# Patient Record
Sex: Male | Born: 1960 | Race: Black or African American | Hispanic: No | Marital: Married | Smoking: Never smoker
Health system: Southern US, Community
[De-identification: ages and names within clinical notes are randomized; demographics above are authoritative.]

## PROBLEM LIST (undated history)

## (undated) DIAGNOSIS — D72819 Decreased white blood cell count, unspecified: Secondary | ICD-10-CM

## (undated) DIAGNOSIS — I10 Essential (primary) hypertension: Secondary | ICD-10-CM

## (undated) HISTORY — DX: Decreased white blood cell count, unspecified: D72.819

## (undated) HISTORY — DX: Essential (primary) hypertension: I10

---

## 1999-09-10 ENCOUNTER — Encounter: Payer: Self-pay | Admitting: *Deleted

## 1999-09-10 ENCOUNTER — Ambulatory Visit (HOSPITAL_COMMUNITY): Admission: RE | Admit: 1999-09-10 | Discharge: 1999-09-10 | Payer: Self-pay | Admitting: *Deleted

## 1999-10-15 ENCOUNTER — Ambulatory Visit (HOSPITAL_COMMUNITY): Admission: RE | Admit: 1999-10-15 | Discharge: 1999-10-15 | Payer: Self-pay | Admitting: Endocrinology

## 2005-11-28 HISTORY — PX: VASECTOMY: SHX75

## 2006-03-06 ENCOUNTER — Ambulatory Visit (HOSPITAL_BASED_OUTPATIENT_CLINIC_OR_DEPARTMENT_OTHER): Admission: RE | Admit: 2006-03-06 | Discharge: 2006-03-06 | Payer: Self-pay | Admitting: Urology

## 2006-03-06 ENCOUNTER — Encounter (INDEPENDENT_AMBULATORY_CARE_PROVIDER_SITE_OTHER): Payer: Self-pay | Admitting: *Deleted

## 2008-05-06 ENCOUNTER — Encounter: Admission: RE | Admit: 2008-05-06 | Discharge: 2008-05-06 | Payer: Self-pay | Admitting: Family Medicine

## 2008-11-28 HISTORY — PX: OTHER SURGICAL HISTORY: SHX169

## 2008-11-28 HISTORY — PX: APPENDECTOMY: SHX54

## 2009-03-31 ENCOUNTER — Emergency Department (HOSPITAL_COMMUNITY): Admission: EM | Admit: 2009-03-31 | Discharge: 2009-03-31 | Payer: Self-pay | Admitting: Family Medicine

## 2009-03-31 ENCOUNTER — Observation Stay (HOSPITAL_COMMUNITY): Admission: EM | Admit: 2009-03-31 | Discharge: 2009-04-01 | Payer: Self-pay | Admitting: Emergency Medicine

## 2009-03-31 ENCOUNTER — Encounter (INDEPENDENT_AMBULATORY_CARE_PROVIDER_SITE_OTHER): Payer: Self-pay | Admitting: General Surgery

## 2010-11-12 ENCOUNTER — Ambulatory Visit: Payer: Self-pay | Admitting: Oncology

## 2011-01-14 ENCOUNTER — Other Ambulatory Visit: Payer: Self-pay | Admitting: Oncology

## 2011-01-14 ENCOUNTER — Encounter (HOSPITAL_BASED_OUTPATIENT_CLINIC_OR_DEPARTMENT_OTHER): Payer: BC Managed Care – PPO | Admitting: Oncology

## 2011-01-14 ENCOUNTER — Encounter: Payer: Self-pay | Admitting: Oncology

## 2011-01-14 DIAGNOSIS — D72819 Decreased white blood cell count, unspecified: Secondary | ICD-10-CM

## 2011-01-14 LAB — MORPHOLOGY: RBC Comments: NORMAL

## 2011-01-14 LAB — CBC WITH DIFFERENTIAL/PLATELET
BASO%: 0.6 % (ref 0.0–2.0)
Basophils Absolute: 0 10*3/uL (ref 0.0–0.1)
EOS%: 2 % (ref 0.0–7.0)
Eosinophils Absolute: 0.1 10*3/uL (ref 0.0–0.5)
HCT: 37.8 % — ABNORMAL LOW (ref 38.4–49.9)
HGB: 13.1 g/dL (ref 13.0–17.1)
LYMPH%: 28.9 % (ref 14.0–49.0)
MCH: 30.9 pg (ref 27.2–33.4)
MCHC: 34.7 g/dL (ref 32.0–36.0)
MCV: 88.9 fL (ref 79.3–98.0)
MONO#: 0.5 10*3/uL (ref 0.1–0.9)
MONO%: 14.9 % — ABNORMAL HIGH (ref 0.0–14.0)
NEUT#: 1.9 10*3/uL (ref 1.5–6.5)
NEUT%: 53.6 % (ref 39.0–75.0)
Platelets: 185 10*3/uL (ref 140–400)
RBC: 4.25 10*6/uL (ref 4.20–5.82)
RDW: 13.2 % (ref 11.0–14.6)
WBC: 3.6 10*3/uL — ABNORMAL LOW (ref 4.0–10.3)
lymph#: 1 10*3/uL (ref 0.9–3.3)

## 2011-01-14 LAB — COMPREHENSIVE METABOLIC PANEL
AST: 31 U/L (ref 0–37)
Alkaline Phosphatase: 47 U/L (ref 39–117)
BUN: 12 mg/dL (ref 6–23)
Creatinine, Ser: 1.2 mg/dL (ref 0.40–1.50)
Total Bilirubin: 0.4 mg/dL (ref 0.3–1.2)

## 2011-01-14 LAB — CHCC SMEAR

## 2011-01-17 LAB — ANA: Anti Nuclear Antibody(ANA): NEGATIVE

## 2011-03-08 LAB — URINALYSIS, ROUTINE W REFLEX MICROSCOPIC
Bilirubin Urine: NEGATIVE
Glucose, UA: NEGATIVE mg/dL
Hgb urine dipstick: NEGATIVE
Ketones, ur: NEGATIVE mg/dL
Leukocytes, UA: NEGATIVE
Nitrite: NEGATIVE
Protein, ur: 30 mg/dL — AB
Specific Gravity, Urine: 1.027 (ref 1.005–1.030)
Urobilinogen, UA: 1 mg/dL (ref 0.0–1.0)
pH: 7 (ref 5.0–8.0)

## 2011-03-08 LAB — COMPREHENSIVE METABOLIC PANEL
ALT: 31 U/L (ref 0–53)
AST: 44 U/L — ABNORMAL HIGH (ref 0–37)
Albumin: 3.8 g/dL (ref 3.5–5.2)
Alkaline Phosphatase: 52 U/L (ref 39–117)
BUN: 9 mg/dL (ref 6–23)
CO2: 29 mEq/L (ref 19–32)
Calcium: 9.1 mg/dL (ref 8.4–10.5)
Chloride: 99 mEq/L (ref 96–112)
Creatinine, Ser: 1.19 mg/dL (ref 0.4–1.5)
GFR calc Af Amer: >60 mL/min (ref >60)
GFR calc non Af Amer: >60 mL/min (ref >60)
Glucose, Bld: 117 mg/dL — ABNORMAL HIGH (ref 70–99)
Potassium: 4.8 mEq/L (ref 3.5–5.1)
Sodium: 136 mEq/L (ref 135–145)
Total Bilirubin: 1.4 mg/dL — ABNORMAL HIGH (ref 0.3–1.2)
Total Protein: 7.4 g/dL (ref 6.0–8.3)

## 2011-03-08 LAB — DIFFERENTIAL
Basophils Absolute: 0 10*3/uL (ref 0.0–0.1)
Basophils Relative: 0 % (ref 0–1)
Eosinophils Absolute: 0 10*3/uL (ref 0.0–0.7)
Eosinophils Relative: 0 % (ref 0–5)
Lymphocytes Relative: 11 % — ABNORMAL LOW (ref 12–46)
Lymphs Abs: 0.8 10*3/uL (ref 0.7–4.0)
Monocytes Absolute: 0.7 10*3/uL (ref 0.1–1.0)
Monocytes Relative: 10 % (ref 3–12)
Neutro Abs: 5.9 10*3/uL (ref 1.7–7.7)
Neutrophils Relative %: 79 % — ABNORMAL HIGH (ref 43–77)

## 2011-03-08 LAB — CBC
HCT: 40.7 % (ref 39.0–52.0)
Hemoglobin: 14.3 g/dL (ref 13.0–17.0)
MCHC: 35 g/dL (ref 30.0–36.0)
MCV: 89.6 fL (ref 78.0–100.0)
Platelets: 167 10*3/uL (ref 150–400)
RBC: 4.54 MIL/uL (ref 4.22–5.81)
RDW: 14.1 % (ref 11.5–15.5)
WBC: 7.5 10*3/uL (ref 4.0–10.5)

## 2011-03-08 LAB — LIPASE, BLOOD: Lipase: 18 U/L (ref 11–59)

## 2011-03-08 LAB — URINE MICROSCOPIC-ADD ON

## 2011-04-12 NOTE — H&P (Signed)
NAME:  Jay Arnold, Jay Arnold NO.:  [id number]   MEDICAL RECORD NO.:  [id number]          PATIENT TYPE:  INP   LOCATION:  5120                         FACILITY:  [facility]   PHYSICIAN:  Sandria Bales. Ezzard Standing, M.D.  DATE OF BIRTH:  1961-06-27   DATE OF ADMISSION:  03/31/2009  DATE OF DISCHARGE:                              HISTORY & PHYSICAL   Date of Admission - May 2010   HISTORY OF ILLNESS:  This is a 50 year old otherwise healthy black male.  He is the patient of Dr. Joselyn Arrow, started having abdominal pain on  Sunday the May 2.  This pain got worse and localized to the right lower  quadrant.  He came to the [facility] Emergency Room today, and underwent  an evaluation.   Apparently, he gets claustrophobic going to the CAT scan, so he was  gavin Ativan, to sedate him throught the CAT scan.  His CAT scan showed  evidence of acute appendicitis.   The patient himself denies any history of peptic ulcer disease, liver  disease, gallbladder disease, pancreas disease, or colon disease.  He  had no prior abdominal surgery.  Actually, he had a normal white blood  count.   PAST MEDICAL HISTORY:  He has no allergies.   He is on no medications.   REVIEW OF SYSTEMS:  NEUROLOGIC:  He has had no history of seizure or  loss of conscious.  PULMONARY:  Does not smoke cigarettes.  Denies pneumonia or  tuberculosis.  CARDIAC:  He has no history of heart disease, chest pain, or  hypertension.  GASTROINTESTINAL:  See history of present illness.  UROLOGIC:  No kidney stones or kidney infections.  MUSCULOSKELETAL:  No back or joint problems.   He is married.  His wife's name is Elnita Maxwell, her phone number is [phone].  She was not there, but I talked to her on the phone.   He works as Engineer, building services for [employer].   PHYSICAL EXAMINATION:  VITAL SIGNS:  His temperature is 98.1, blood  pressure 122/74, pulse is 79, respirations 16, sats were 95%.  GENERAL:  He is a well-nourished,  pleasant black male, alert and  cooperative on physical exam.  HEENT:  Unremarkable.  NECK:  Supple without mass, without thyromegaly.  LUNGS:  Clear to auscultation with symmetric breath sounds.  HEART:  Regular rate and rhythm without murmur or rub.  ABDOMEN:  He is a fully stocky guy, but he has got tenderness in the  right lower quadrant with some mild guarding, mild rebound, but he  actually has active bowel sounds.  I see no abdominal scars.  He has no  abdominal hernia.  EXTREMITIES:  He has good strength in all 4 extremities.  NEUROLOGIC:  Grossly intact.   LABORATORY DATA:  Lipase of 18.  He has a white blood count of 7400, but  that is not typed up in the sheet that I have from the ER.  I reviewed his CT scan with Paulina Fusi which was consistent with acute  appendicitis where his pain is located.   IMPRESSION:  Acute appendicitis.  I discussed with the patient and his  wife, indications, potential complications of appendectomy, potential  complications include bleeding, infection, which I think he already has.  I will try this laparoscopically, but there is always chance of open  surgery and there is chance that there could be something else causing  his pain rather appendicitis.  He otherwise though seemed to be a  healthy male who I think is a good candidate for surgery and proceeding  with surgery at this time.      Sandria Bales. Ezzard Standing, M.D.  Electronically Signed     DHN/MEDQ  D:  03/31/2009  T:  04/01/2009  Job:  [id number]   cc:   Lavonda Jumbo, M.D.

## 2011-04-12 NOTE — Op Note (Signed)
NAME:  Jay Arnold, Jay Arnold NO.:  [id number]   MEDICAL RECORD NO.:  [id number]          PATIENT TYPE:  INP   LOCATION:  5120                         FACILITY:  [facility]   PHYSICIAN:  Sandria Bales. Ezzard Standing, M.D.  DATE OF BIRTH:  1961/04/09   DATE OF PROCEDURE:  03/31/2009  DATE OF DISCHARGE:  03/31/2009                               OPERATIVE REPORT   Date of Surgery - May 2010   PREOPERATIVE DIAGNOSIS:  Appendicitis.   POSTOPERATIVE DIAGNOSIS:  Appendicitis.   PROCEDURE:  Laparoscopic appendectomy.   SURGEON:  Sandria Bales. Ezzard Standing, MD   FIRST ASSISTANT:  None.   ANESTHESIA:  General endotracheal.   ESTIMATED BLOOD LOSS:  Minimal.   INDICATIONS FOR THE PROCEDURE:  Ms. Allinson is a 50 year old black male  who presented with a 2-day history of abdominal pain.  She is a patient  of Dr. Joselyn Arrow, who came to the [facility] Emergency Room.  The CT  scan was consistent with appendicitis.  I discussed with the patient  about proceeding with appendectomy.  I discussed the indications and  potential complications.   The potential complications of appendectomy include, but are not limited  to, bleeding, infection (which I think he already has), open surgery,  and the need for bowel resection or other diagnosis causing this pain.   OPERATIVE NOTE:  The patient was placed in a supine position with left arm tucked, right  arm out.  Foley catheter placed.  Abdomen was shaved, prepped, with  Betadine solution and sterilely draped, and given a general endotracheal  anesthetic supervised by Dr. Laverle Hobby.  A time-out was held  identifying the patient and the procedure.   The abdomen was accessed with a Hasson trocar through an infraumbilical  incision.  The Hasson trocar was secured with a 0 Vicryl suture.  A 5-mm  trocar was placed in the right upper quadrant and 11 mm in left lower  quadrant.  Abdominal exploration carried out.  The right and left lobes  of liver were  unremarkable.  Stomach unremarkable.  The bowel I could  see was unremarkable.  In the right lower quadrant was an inflammatory  mass, which was consistent with appendicitis and actually the  antimesenteric fold of the terminal ileum was wrapped over the appendix.  I had to peel that off and the appendix was sort of curled underneath  the cecum which I had to mobilize the cecum up and medially to get to  the base.   He had gangrenous changes of the distal end of the appendix with some  surrounding purulence.  I took down the mesentery of the appendix with a  harmonic scalpel.  I got to the base of appendix.  I used a vascular  load of the [employer] GIA 45-mm stapler across the base of the  appendix and divided this.   The appendix was then placed EndoCatch bag and delivered through the  umbilicus.  The abdomen was irrigated with saline.   I then reinspected the staple line and cecum which all looked okay.   I removed all the trocars  under direct visualization.  There was no  bleeding at trocar site.  I closed the umbilical port with a 0 Vicryl  suture.  The skin at each site was closed the 5-0 Vicryl suture, painted  the wounds with tincture of benzoin and Steri-Strips.   The patient tolerated the procedure well and was transported to the  recovery room in good condition.  Sponge and needle counts were correct  at the end of the case.      Sandria Bales. Ezzard Standing, M.D.  Electronically Signed     DHN/MEDQ  D:  03/31/2009  T:  04/01/2009  Job:  [id number]   cc:   Lavonda Jumbo, M.D.

## 2011-04-15 NOTE — Op Note (Signed)
NAME:  Jay Arnold, RUPPERT NO.:  [id number]   MEDICAL RECORD NO.:  [id number]          PATIENT TYPE:  AMB   LOCATION:  [facility]                         FACILITY:  [facility]   PHYSICIAN:  Lindaann Slough, M.D.  DATE OF BIRTH:  1960/12/22   DATE OF PROCEDURE:  03/06/2006  DATE OF DISCHARGE:                                 OPERATIVE REPORT   PREOPERATIVE DIAGNOSIS:  Phimosis and elective sterilization procedure   POSTOPERATIVE DIAGNOSIS: Same   PROCEDURE: circumcision, bilateral vasectomy.   SURGEON:  Dr. Brunilda Payor.   ANESTHESIA:  General.   INDICATIONS:  The patient is a 50 years old male who had been complaining of  difficulty retracting his foreskin.  He has had multiple breakdowns of the  foreskin.  He also would like to have a vasectomy.  Both procedures have  been discussed in detail with the patient and the risks include but are not  limited to hemorrhage, infection and  specifically regarding the vasectomy,  early or late recanalization.  He understands and would like to proceed.   Under general anesthesia, the patient was prepped and draped and placed in  the supine position.  Two circumferential incisions were made on the  foreskin and the foreskin between those two incisions was excised.  Hemostasis was secured with electrocautery.  Then skin approximation was  done with #4-0 chromic.  A penile block was done with 0.5% Marcaine.   Then a 0.5 cm incision was made on the left scrotum.  The incision was  carried down to the subcutaneous tissues.  The vas was secured with a vas  clamp.  It was then separated from the vas sheath.  A segment of the vas was  excised.  Each end of the vas was then fulgurated and doubly ligated with #0  Vicryl.  The distal end of the vas was then covered with the vas sheath.  Then the incision was closed with #3-0 Vicryl.  The same procedure was then  done on the right side.   The patient tolerated the procedure well and left the OR in  satisfactory  condition to post anesthesia care unit.      Lindaann Slough, M.D.  Electronically Signed     MN/MEDQ  D:  03/06/2006  T:  03/07/2006  Job:  [id number]

## 2011-06-15 ENCOUNTER — Encounter (HOSPITAL_BASED_OUTPATIENT_CLINIC_OR_DEPARTMENT_OTHER): Payer: BC Managed Care – PPO | Admitting: Oncology

## 2011-06-15 ENCOUNTER — Other Ambulatory Visit: Payer: Self-pay | Admitting: Oncology

## 2011-06-15 DIAGNOSIS — D72819 Decreased white blood cell count, unspecified: Secondary | ICD-10-CM

## 2011-06-15 LAB — CBC WITH DIFFERENTIAL/PLATELET
BASO%: 0.6 % (ref 0.0–2.0)
MCHC: 34.3 g/dL (ref 32.0–36.0)
MONO#: 0.5 10*3/uL (ref 0.1–0.9)
RBC: 4.27 10*6/uL (ref 4.20–5.82)
RDW: 14.3 % (ref 11.0–14.6)
WBC: 3.5 10*3/uL — ABNORMAL LOW (ref 4.0–10.3)
lymph#: 1.2 10*3/uL (ref 0.9–3.3)

## 2011-11-04 ENCOUNTER — Encounter: Payer: Self-pay | Admitting: *Deleted

## 2011-11-07 ENCOUNTER — Encounter: Payer: Self-pay | Admitting: Oncology

## 2011-11-07 DIAGNOSIS — D72819 Decreased white blood cell count, unspecified: Secondary | ICD-10-CM | POA: Insufficient documentation

## 2011-11-14 ENCOUNTER — Ambulatory Visit (HOSPITAL_BASED_OUTPATIENT_CLINIC_OR_DEPARTMENT_OTHER): Payer: BC Managed Care – PPO | Admitting: Oncology

## 2011-11-14 ENCOUNTER — Other Ambulatory Visit: Payer: Self-pay | Admitting: Oncology

## 2011-11-14 ENCOUNTER — Encounter: Payer: Self-pay | Admitting: Gastroenterology

## 2011-11-14 ENCOUNTER — Other Ambulatory Visit (HOSPITAL_BASED_OUTPATIENT_CLINIC_OR_DEPARTMENT_OTHER): Payer: BC Managed Care – PPO | Admitting: Lab

## 2011-11-14 VITALS — BP 152/93 | HR 68 | Temp 97.9°F | Ht 70.0 in | Wt 200.5 lb

## 2011-11-14 DIAGNOSIS — K921 Melena: Secondary | ICD-10-CM

## 2011-11-14 DIAGNOSIS — Z800 Family history of malignant neoplasm of digestive organs: Secondary | ICD-10-CM

## 2011-11-14 DIAGNOSIS — Z23 Encounter for immunization: Secondary | ICD-10-CM

## 2011-11-14 DIAGNOSIS — D72819 Decreased white blood cell count, unspecified: Secondary | ICD-10-CM

## 2011-11-14 LAB — COMPREHENSIVE METABOLIC PANEL WITH GFR
ALT: 27 U/L (ref 0–53)
AST: 27 U/L (ref 0–37)
Albumin: 4 g/dL (ref 3.5–5.2)
Alkaline Phosphatase: 46 U/L (ref 39–117)
BUN: 16 mg/dL (ref 6–23)
CO2: 29 meq/L (ref 19–32)
Calcium: 8.7 mg/dL (ref 8.4–10.5)
Chloride: 103 meq/L (ref 96–112)
Creatinine, Ser: 1.06 mg/dL (ref 0.50–1.35)
Glucose, Bld: 119 mg/dL — ABNORMAL HIGH (ref 70–99)
Potassium: 3.9 meq/L (ref 3.5–5.3)
Sodium: 140 meq/L (ref 135–145)
Total Bilirubin: 0.6 mg/dL (ref 0.3–1.2)
Total Protein: 6.8 g/dL (ref 6.0–8.3)

## 2011-11-14 LAB — CBC WITH DIFFERENTIAL/PLATELET
BASO%: 0.5 % (ref 0.0–2.0)
Basophils Absolute: 0 10*3/uL (ref 0.0–0.1)
HCT: 38.6 % (ref 38.4–49.9)
HGB: 13.3 g/dL (ref 13.0–17.1)
MONO#: 0.4 10*3/uL (ref 0.1–0.9)
NEUT%: 57 % (ref 39.0–75.0)
WBC: 3.8 10*3/uL — ABNORMAL LOW (ref 4.0–10.3)
lymph#: 1.1 10*3/uL (ref 0.9–3.3)

## 2011-11-14 MED ORDER — INFLUENZA VIRUS VACC SPLIT PF IM SUSP
0.5000 mL | INTRAMUSCULAR | Status: DC
  Filled 2011-11-14: qty 0.5

## 2011-11-14 MED ORDER — INFLUENZA VIRUS VACC SPLIT PF IM SUSP
0.5000 mL | Freq: Once | INTRAMUSCULAR | Status: AC
  Administered 2011-11-14: 0.5 mL via INTRAMUSCULAR
  Filled 2011-11-14: qty 0.5

## 2011-11-14 NOTE — Progress Notes (Signed)
[facility] OFFICE PROGRESS NOTE  Cc:  Cain Saupe, M.D.  DIAGNOSIS: leukocytopenia, NOS; benign etiology.   CURRENT THERAPY: watchful observation.   INTERVAL HISTORY: Jay Arnold 50 y.o. male returns for regular follow up.  He reports feeling well.  He denies anorexia, weight loss, palpable lymph node swelling, sinus infection, sinus pain, nasal congestion, prepped a cough, chest pain, palpitation, melena,  hematuria, skin rash, focal bone pain or weakness.  Early this month he had one episode of hematochezia where the tolerable completely filled with blood. He thought that he had history of hemorrhoid however never had any painful rectal sensation. Of note he has 2 maternal uncles with colon cancer in their 91s. He has not had screening colonoscopy yet because he is only 50 years old.  He denies any palpable anal or rectal mass.  MEDICAL HISTORY: Past Medical History  Diagnosis Date  . Leukocytopenia     SURGICAL HISTORY:  Past Surgical History  Procedure Date  . Vasectomy 2007  . Appendectomy 2010  . Left neck cyst removal 2010    MEDICATIONS: Current Outpatient Prescriptions  Medication Sig Dispense Refill  . Ginkgo Biloba 40 MG TABS Take by mouth.        Marland Kitchen glucosamine-chondroitin 500-400 MG tablet Take 1 tablet by mouth 3 (three) times daily.        . Multiple Vitamin (MULTIVITAMIN) tablet Take 1 tablet by mouth daily.         Current Facility-Administered Medications  Medication Dose Route Frequency Provider Last Rate Last Dose  . influenza  inactive virus vaccine (FLUZONE/FLUARIX) injection 0.5 mL  0.5 mL Intramuscular Once Jethro Bolus, MD   0.5 mL at 11/14/11 1545  . DISCONTD: influenza  inactive virus vaccine (FLUZONE/FLUARIX) injection 0.5 mL  0.5 mL Intramuscular Tomorrow-1000 Jethro Bolus, MD        ALLERGIES:   has no known allergies.  REVIEW OF SYSTEMS:  The rest of the 14-point review of system was negative.   Filed Vitals:   11/14/11 1459  BP:  152/93  Pulse: 68  Temp: 97.9 F (36.6 C)   Wt Readings from Last 3 Encounters:  11/14/11 200 lb 8 oz (90.946 kg)  01/14/11 205 lb 8 oz (93.214 kg)   ECOG Performance status: 0  PHYSICAL EXAMINATION:  General:  well-nourished in no acute distress.  Eyes:  no scleral icterus.  ENT:  There were no oropharyngeal lesions.  Neck was without thyromegaly.  Lymphatics:  Negative cervical, supraclavicular or axillary adenopathy.  Respiratory: lungs were clear bilaterally without wheezing or crackles.  Cardiovascular:  Regular rate and rhythm, S1/S2, without murmur, rub or gallop.  There was no pedal edema.  GI:  abdomen was soft, flat, nontender, nondistended, without organomegaly.  Muscoloskeletal:  no spinal tenderness of palpation of vertebral spine.  Skin exam was without echymosis, petichae.  Neuro exam was nonfocal.  Patient was able to get on and off exam table without assistance.  Gait was normal.  Patient was alerted and oriented.  Attention was good.   Language was appropriate.  Mood was normal without depression.  Speech was not pressured.  Thought content was not tangential.  He deferred rectal exam.      LABORATORY/RADIOLOGY DATA:  Lab Results  Component Value Date   WBC 3.8* 11/14/2011   HGB 13.3 11/14/2011   HCT 38.6 11/14/2011   PLT 181 11/14/2011   GLUCOSE 109* 01/14/2011   ALT 30 01/14/2011   AST 31 01/14/2011   NA  143 01/14/2011   K 3.9 01/14/2011   CL 104 01/14/2011   CREATININE 1.20 01/14/2011   BUN 12 01/14/2011   CO2 28 01/14/2011     ASSESSMENT AND PLAN:   1.  Leukocytopenia:  Most likely benign.  It has been fluctuating.  He does not have pancytopenia nor neutropenia.  Today, his WBC and ANC have improved compared to prior.  Recommend to continue observation with lab only in 4 and 8 months here at [facility].  I'll see him in 12 months.  A diagnostic bone marrow biopsy in his case has very low clinical utility given the only mild leukopenia. In the future, if he  develops pancytopenia or secondly depressed neutropenia I may consider bone a biopsy at that time.  2. Hematochezia without anemia: I discussed with Jay Arnold that he had maternal uncles with colon cancer in her 77s. Even though he does not have anemia I still would like him to see GI to see whether screening colonoscopy is indicated this time given this high risk features. I make this referral for the next one to 2 months.  3.  Primary care: He hasn't received his yearly influenza vaccination yet. Given his leukocytopenia he is satisfied high risk of influenza infection. Iiscussed with him the pros and cons of influenza vaccination. He expressed nformed consent and went ahead with the flu shot today.

## 2011-12-07 ENCOUNTER — Encounter: Payer: Self-pay | Admitting: Gastroenterology

## 2011-12-07 ENCOUNTER — Ambulatory Visit (INDEPENDENT_AMBULATORY_CARE_PROVIDER_SITE_OTHER): Payer: BC Managed Care – PPO | Admitting: Gastroenterology

## 2011-12-07 VITALS — BP 120/78 | HR 76 | Ht 69.0 in | Wt 200.0 lb

## 2011-12-07 DIAGNOSIS — K921 Melena: Secondary | ICD-10-CM

## 2011-12-07 MED ORDER — PEG-KCL-NACL-NASULF-NA ASC-C 100 G PO SOLR: 1.0000 | Freq: Once | ORAL | Status: DC

## 2011-12-07 NOTE — Patient Instructions (Addendum)
You have been scheduled for a Colonoscopy. See separate instructions. Pick up your prep kit from your pharmacy.  cc: Cain Saupe, MD       Jethro Bolus, MD

## 2011-12-07 NOTE — Progress Notes (Signed)
History of Present Illness: This is a 51 year old male who relates 2 episodes of small amounts of bright red rectal bleeding with bowel movements over the past several months. During his office visit with Dr. Gaylyn Rong he informed him that he had 2 uncles with colon cancer however he has since been determined that they had prostate cancer and there is no family history of colon cancer. Denies weight loss, abdominal pain, constipation, diarrhea, change in stool caliber, melena, nausea, vomiting, dysphagia, reflux symptoms, chest pain.  Review of Systems: Pertinent positive and negative review of systems were noted in the above HPI section. All other review of systems were otherwise negative.  Current Medications, Allergies, Past Medical History, Past Surgical History, Family History and Social History were reviewed in [employer] record.  Physical Exam: General: Well developed , well nourished, no acute distress Head: Normocephalic and atraumatic Eyes:  sclerae anicteric, EOMI Ears: Normal auditory acuity Mouth: No deformity or lesions Neck: Supple, no masses or thyromegaly Lungs: Clear throughout to auscultation Heart: Regular rate and rhythm; no murmurs, rubs or bruits Abdomen: Soft, non tender and non distended. No masses, hepatosplenomegaly or hernias noted. Normal Bowel sounds Rectal: Deferred to colonoscopy Musculoskeletal: Symmetrical with no gross deformities  Skin: No lesions on visible extremities Pulses:  Normal pulses noted Extremities: No clubbing, cyanosis, edema or deformities noted Neurological: Alert oriented x 4, grossly nonfocal Cervical Nodes:  No significant cervical adenopathy Inguinal Nodes: No significant inguinal adenopathy Psychological:  Alert and cooperative. Normal mood and affect  Assessment and Recommendations:  1. Hematochezia, intermittent, small volume. I suspect a benign source of bleeding such as hemorrhoids however colorectal neoplasms  need to be excluded. The risks, benefits, and alternatives to colonoscopy with possible biopsy and possible polypectomy were discussed with the patient and they consent to proceed.

## 2011-12-23 ENCOUNTER — Encounter: Payer: Self-pay | Admitting: Gastroenterology

## 2011-12-23 ENCOUNTER — Ambulatory Visit (AMBULATORY_SURGERY_CENTER): Payer: BC Managed Care – PPO | Admitting: Gastroenterology

## 2011-12-23 VITALS — BP 143/90 | HR 64 | Temp 97.8°F | Resp 12 | Ht 69.0 in | Wt 200.0 lb

## 2011-12-23 DIAGNOSIS — D126 Benign neoplasm of colon, unspecified: Secondary | ICD-10-CM

## 2011-12-23 DIAGNOSIS — K921 Melena: Secondary | ICD-10-CM

## 2011-12-23 MED ORDER — SODIUM CHLORIDE 0.9 % IV SOLN: 500.0000 mL | INTRAVENOUS | Status: DC

## 2011-12-23 NOTE — Progress Notes (Signed)
Patient did not experience any of the following events: a burn prior to discharge; a fall within the facility; wrong site/side/patient/procedure/implant event; or a hospital transfer or hospital admission upon discharge from the facility. (G8907) Patient did not have preoperative order for IV antibiotic SSI prophylaxis. (G8918)  

## 2011-12-23 NOTE — Patient Instructions (Signed)
FOLLOW DISCHARGE INSTRUCTIONS (BLUE AND GREEN SHEETS).. 

## 2011-12-23 NOTE — Op Note (Signed)
[facility] [address] [place], [place]  [address]  COLONOSCOPY PROCEDURE REPORT PATIENT:  Jay Arnold, Jay Arnold  MR#:  [id number] BIRTHDATE:  1961/02/02, 50 yrs. old  GENDER:  male ENDOSCOPIST:  Judie Petit T. Russella Dar, MD, [facility] Referred by:  Jethro Bolus, M.D. PROCEDURE DATE:  12/23/2011 PROCEDURE:  Colonoscopy with biopsy and snare polypectomy ASA CLASS:  Class I INDICATIONS:  1) hematochezia MEDICATIONS:   These medications were titrated to patient response per physician's verbal order, Fentanyl 100 mcg IV, Versed 8 mg IV DESCRIPTION OF PROCEDURE:  After the risks benefits and alternatives of the procedure were thoroughly explained, informed consent was obtained.  Digital rectal exam was performed and revealed no abnormalities.  The LB CF-[id number] endoscope was introduced through the anus and advanced to the cecum, which was identified by both the appendix and ileocecal valve, without limitations.  The quality of the prep was excellent, using MoviPrep.  The instrument was then slowly withdrawn as the colon was fully examined. <<PROCEDUREIMAGES>> FINDINGS:  A sessile polyp was found in the proximal transverse colon. It was 4 mm in size. The polyp was removed using cold biopsy forceps.  A sessile polyp was found in the descending colon. It was 6 mm in size. Polyp was snared, then cauterized with monopolar cautery. Retrieval was successful. Otherwise normal colonoscopy without other polyps, masses, vascular ectasias, or inflammatory changes. Retroflexed views in the rectum revealed internal hemorrhoids, small. The time to cecum =  2.67  minutes. The scope was then withdrawn (time =  8  min) from the patient and the procedure completed.  COMPLICATIONS:  None  ENDOSCOPIC IMPRESSION: 1) 4 mm sessile polyp in the proximal transverse colon 2) 6 mm sessile polyp in the descending colon 3) Internal hemorrhoids  RECOMMENDATIONS: 1) Await pathology results 2) If the polyps are  adenomatous (pre-cancerous), repeat colonoscopy in 5 years. Otherwise follow colorectal cancer screening guidelines for "routine risk" patients with colonoscopy in 10 years.  Venita Lick. Russella Dar, MD, [facility]  n. eSIGNED:   Venita Lick. Jhoana Upham at 12/23/2011 02:48 PM  Juliette Alcide, [phone]

## 2011-12-26 ENCOUNTER — Telehealth: Payer: Self-pay | Admitting: *Deleted

## 2011-12-26 NOTE — Telephone Encounter (Signed)
  Follow up Call-  Call back number 12/23/2011  Post procedure Call Back phone  # [phone], may leave a message     Patient questions:  Do you have a fever, pain , or abdominal swelling? no Pain Score  0 *  Have you tolerated food without any problems? yes  Have you been able to return to your normal activities? yes  Do you have any questions about your discharge instructions: Diet   no Medications  no Follow up visit  no  Do you have questions or concerns about your Care? no  Actions: * If pain score is 4 or above: No action needed, pain <4.

## 2011-12-28 ENCOUNTER — Encounter: Payer: Self-pay | Admitting: Gastroenterology

## 2012-03-13 ENCOUNTER — Other Ambulatory Visit: Payer: BC Managed Care – PPO | Admitting: Lab

## 2012-04-04 ENCOUNTER — Other Ambulatory Visit (HOSPITAL_BASED_OUTPATIENT_CLINIC_OR_DEPARTMENT_OTHER): Payer: BC Managed Care – PPO | Admitting: Lab

## 2012-04-04 DIAGNOSIS — D72819 Decreased white blood cell count, unspecified: Secondary | ICD-10-CM

## 2012-04-04 LAB — CBC WITH DIFFERENTIAL/PLATELET
BASO%: 0.3 % (ref 0.0–2.0)
Basophils Absolute: 0 10*3/uL (ref 0.0–0.1)
EOS%: 3.4 % (ref 0.0–7.0)
HGB: 13.8 g/dL (ref 13.0–17.1)
MCH: 29.9 pg (ref 27.2–33.4)
MCHC: 34.3 g/dL (ref 32.0–36.0)
MONO%: 8.8 % (ref 0.0–14.0)
RBC: 4.61 10*6/uL (ref 4.20–5.82)
RDW: 13.6 % (ref 11.0–14.6)
lymph#: 1.1 10*3/uL (ref 0.9–3.3)

## 2012-04-06 ENCOUNTER — Telehealth: Payer: Self-pay

## 2012-04-06 NOTE — Telephone Encounter (Signed)
Message copied by Kallie Locks on Fri Apr 06, 2012  4:39 PM ------      Message from: HA, Raliegh Ip T      Created: Wed Apr 04, 2012  8:49 AM       Please call pt.  His WBC is still low; but ANC and rest of CBC still normal.  Continue observation.  Thanks.

## 2012-07-11 ENCOUNTER — Other Ambulatory Visit (HOSPITAL_BASED_OUTPATIENT_CLINIC_OR_DEPARTMENT_OTHER): Payer: BC Managed Care – PPO | Admitting: Lab

## 2012-07-11 DIAGNOSIS — D72819 Decreased white blood cell count, unspecified: Secondary | ICD-10-CM

## 2012-07-11 LAB — CBC WITH DIFFERENTIAL/PLATELET
Basophils Absolute: 0 10*3/uL (ref 0.0–0.1)
Eosinophils Absolute: 0.1 10*3/uL (ref 0.0–0.5)
HGB: 13.3 g/dL (ref 13.0–17.1)
MONO#: 0.3 10*3/uL (ref 0.1–0.9)
NEUT#: 1.4 10*3/uL — ABNORMAL LOW (ref 1.5–6.5)
RBC: 4.4 10*6/uL (ref 4.20–5.82)
RDW: 13.6 % (ref 11.0–14.6)
WBC: 2.9 10*3/uL — ABNORMAL LOW (ref 4.0–10.3)
lymph#: 1 10*3/uL (ref 0.9–3.3)

## 2012-07-13 ENCOUNTER — Telehealth: Payer: Self-pay

## 2012-07-13 NOTE — Telephone Encounter (Signed)
Message copied by Kallie Locks on Fri Jul 13, 2012  4:17 PM ------      Message from: HA, Raliegh Ip T      Created: Wed Jul 11, 2012  9:32 AM       Please call pt.  His WBC/ANC have slightly decreased.  If he has no symptoms (severe fatigue; weight loss; recurrent infection), then I again recommend watchful observation.  A bone marrow will most likely be of low yield unless he is symptomatic from his leukopenia.              Thanks.

## 2012-11-14 NOTE — Patient Instructions (Addendum)
1.  Issue:  Leukopenia/neutropenia (low white blood cell count WBC and neutrophil). 2.  Potential cause:  Congenital.  I have low clinical suspicion of a bone marrow failure process or leukemia since the other 2 cell lines are still normal. 3.  Recommendation:  Watchful observation.  We may consider diagnostic bone marrow biopsy in the future if absolute neutrophil count (ANC) <1 or also with significant anemia and low platelet count.  4.  Follow up:  Discharged from Cancer [facility].  Follow up with primary care physician.  Please check CBC (complete blood count) at least yearly.  Return to the [facility] as needed or when significant low blood count occurs.

## 2012-11-15 ENCOUNTER — Telehealth: Payer: Self-pay | Admitting: Oncology

## 2012-11-15 ENCOUNTER — Other Ambulatory Visit (HOSPITAL_BASED_OUTPATIENT_CLINIC_OR_DEPARTMENT_OTHER): Payer: BC Managed Care – PPO | Admitting: Lab

## 2012-11-15 ENCOUNTER — Ambulatory Visit (HOSPITAL_BASED_OUTPATIENT_CLINIC_OR_DEPARTMENT_OTHER): Payer: BC Managed Care – PPO | Admitting: Oncology

## 2012-11-15 VITALS — BP 140/93 | HR 66 | Temp 98.3°F | Resp 18 | Ht 69.0 in | Wt 202.4 lb

## 2012-11-15 DIAGNOSIS — D72819 Decreased white blood cell count, unspecified: Secondary | ICD-10-CM

## 2012-11-15 LAB — CBC WITH DIFFERENTIAL/PLATELET
BASO%: 0.8 % (ref 0.0–2.0)
Eosinophils Absolute: 0.1 10*3/uL (ref 0.0–0.5)
LYMPH%: 34.2 % (ref 14.0–49.0)
MCHC: 34.3 g/dL (ref 32.0–36.0)
MONO#: 0.4 10*3/uL (ref 0.1–0.9)
NEUT#: 1.8 10*3/uL (ref 1.5–6.5)
Platelets: 183 10*3/uL (ref 140–400)
RBC: 4.49 10*6/uL (ref 4.20–5.82)
RDW: 13.7 % (ref 11.0–14.6)
WBC: 3.6 10*3/uL — ABNORMAL LOW (ref 4.0–10.3)
lymph#: 1.2 10*3/uL (ref 0.9–3.3)

## 2012-11-15 LAB — TSH: TSH: 1.324 u[IU]/mL (ref 0.350–4.500)

## 2012-11-15 LAB — COMPREHENSIVE METABOLIC PANEL (CC13)
ALT: 35 U/L (ref 0–55)
Albumin: 4 g/dL (ref 3.5–5.0)
CO2: 27 mEq/L (ref 22–29)
Calcium: 9.2 mg/dL (ref 8.4–10.4)
Chloride: 105 mEq/L (ref 98–107)
Glucose: 89 mg/dl (ref 70–99)
Sodium: 141 mEq/L (ref 136–145)
Total Bilirubin: 0.66 mg/dL (ref 0.20–1.20)
Total Protein: 7.4 g/dL (ref 6.4–8.3)

## 2012-11-15 LAB — CHCC SMEAR

## 2012-11-15 NOTE — Telephone Encounter (Signed)
Per dr Gaylyn Rong the pt does not need to come back.  He will be followed by his pcp.

## 2012-11-15 NOTE — Progress Notes (Signed)
[facility] OFFICE PROGRESS NOTE  Cc:  Cain Saupe, M.D.  DIAGNOSIS: leukocytopenia, NOS; benign etiology.   CURRENT THERAPY: watchful observation.   INTERVAL HISTORY: Jay Arnold 51 y.o. male returns for regular follow up.  He reports feeling well. He has been working full time without fatigue.  He is very active with working out. He does free weight lifting.  Occasionally, he has left rib discomfort which is not severe enough that requires pain med.  He denied recurrent infection, night sweat, anorexia, weight loss.   The rest of the 14-point review of system was negative.    MEDICAL HISTORY: Past Medical History  Diagnosis Date  . Leukocytopenia     SURGICAL HISTORY:  Past Surgical History  Procedure Date  . Vasectomy 2007  . Appendectomy 2010  . Left neck cyst removal 2010    MEDICATIONS: No current outpatient prescriptions on file.    ALLERGIES:   has no known allergies.  REVIEW OF SYSTEMS:  The rest of the 14-point review of system was negative.   Filed Vitals:   11/15/12 1346  BP: 140/93  Pulse: 66  Temp: 98.3 F (36.8 C)  Resp: 18   Wt Readings from Last 3 Encounters:  11/15/12 202 lb 6.4 oz (91.808 kg)  12/23/11 200 lb (90.719 kg)  12/07/11 200 lb (90.719 kg)   ECOG Performance status: 0  PHYSICAL EXAMINATION:  General:  well-nourished man, in no acute distress.  Eyes:  no scleral icterus.  ENT:  There were no oropharyngeal lesions.  Neck was without thyromegaly.  Lymphatics:  Negative cervical, supraclavicular or axillary adenopathy.  Respiratory: lungs were clear bilaterally without wheezing or crackles.  Cardiovascular:  Regular rate and rhythm, S1/S2, without murmur, rub or gallop.  There was no pedal edema.  GI:  abdomen was soft, flat, nontender, nondistended, without organomegaly.  Muscoloskeletal:  no spinal tenderness of palpation of vertebral spine.  There was no pain or palpable mass in the left ribs.  Skin exam was without  echymosis, petichae.  Neuro exam was nonfocal.  Patient was able to get on and off exam table without assistance.  Gait was normal.  Patient was alerted and oriented.  Attention was good.   Language was appropriate.  Mood was normal without depression.  Speech was not pressured.  Thought content was not tangential.      LABORATORY/RADIOLOGY DATA:  Lab Results  Component Value Date   WBC 3.6* 11/15/2012   HGB 13.8 11/15/2012   HCT 40.3 11/15/2012   PLT 183 11/15/2012   GLUCOSE 89 11/15/2012   ALT 35 11/15/2012   AST 29 11/15/2012   NA 141 11/15/2012   K 4.0 11/15/2012   CL 105 11/15/2012   CREATININE 1.2 11/15/2012   BUN 15.0 11/15/2012   CO2 27 11/15/2012     ASSESSMENT AND PLAN:   1.  Issue:  Leukopenia/neutropenia (low white blood cell count WBC and neutrophil). -  Potential cause:  Congenital.  I have low clinical suspicion of a bone marrow failure process or leukemia since the other 2 cell lines are still normal. -  Recommendation:  Watchful observation.  We may consider diagnostic bone marrow biopsy in the future if absolute neutrophil count (ANC) <1 or also with significant anemia and low platelet count.  - Follow up:  Dis[facility] from [facility].  Follow up with primary care physician.  Please check CBC (complete blood count) at least yearly.  Return to the [facility] as needed.  The length of time of the face-to-face encounter was 10 minutes. More than 50% of time was spent counseling and coordination of care.   Thank you for this referral.

## 2013-05-20 ENCOUNTER — Encounter (HOSPITAL_COMMUNITY): Payer: Self-pay | Admitting: Emergency Medicine

## 2013-05-20 ENCOUNTER — Emergency Department (INDEPENDENT_AMBULATORY_CARE_PROVIDER_SITE_OTHER)
Admission: EM | Admit: 2013-05-20 | Discharge: 2013-05-20 | Disposition: A | Discharge status: Home / Self Care | Payer: BC Managed Care – PPO | Source: Home / Self Care | Attending: Emergency Medicine | Admitting: Emergency Medicine

## 2013-05-20 ENCOUNTER — Emergency Department (INDEPENDENT_AMBULATORY_CARE_PROVIDER_SITE_OTHER): Payer: BC Managed Care – PPO

## 2013-05-20 DIAGNOSIS — T148 Other injury of unspecified body region: Secondary | ICD-10-CM

## 2013-05-20 DIAGNOSIS — R509 Fever, unspecified: Secondary | ICD-10-CM

## 2013-05-20 DIAGNOSIS — W57XXXA Bitten or stung by nonvenomous insect and other nonvenomous arthropods, initial encounter: Secondary | ICD-10-CM

## 2013-05-20 LAB — URINALYSIS, DIPSTICK ONLY
Ketones, ur: 15 mg/dL — AB
Leukocytes, UA: NEGATIVE
Nitrite: NEGATIVE
Specific Gravity, Urine: 1.021 (ref 1.005–1.030)
Urobilinogen, UA: 1 mg/dL (ref 0.0–1.0)
pH: 6 (ref 5.0–8.0)

## 2013-05-20 LAB — POCT I-STAT, CHEM 8
BUN: 17 mg/dL (ref 6–23)
Chloride: 100 mEq/L (ref 96–112)
Creatinine, Ser: 1.3 mg/dL (ref 0.50–1.35)
Glucose, Bld: 94 mg/dL (ref 70–99)
Potassium: 4 mEq/L (ref 3.5–5.1)
Sodium: 135 mEq/L (ref 135–145)

## 2013-05-20 LAB — POCT URINALYSIS DIP (DEVICE)
Bilirubin Urine: NEGATIVE
Glucose, UA: NEGATIVE mg/dL
Ketones, ur: 15 mg/dL — AB
Specific Gravity, Urine: 1.02 (ref 1.005–1.030)

## 2013-05-20 LAB — CBC WITH DIFFERENTIAL/PLATELET
Basophils Absolute: 0 10*3/uL (ref 0.0–0.1)
Eosinophils Relative: 0 % (ref 0–5)
HCT: 42 % (ref 39.0–52.0)
Hemoglobin: 14.8 g/dL (ref 13.0–17.0)
Lymphocytes Relative: 21 % (ref 12–46)
MCV: 86.8 fL (ref 78.0–100.0)
Monocytes Absolute: 0.3 10*3/uL (ref 0.1–1.0)
Monocytes Relative: 13 % — ABNORMAL HIGH (ref 3–12)
RDW: 13.8 % (ref 11.5–15.5)
WBC: 2.2 10*3/uL — ABNORMAL LOW (ref 4.0–10.5)

## 2013-05-20 LAB — SEDIMENTATION RATE: Sed Rate: 10 mm/hr (ref 0–16)

## 2013-05-20 MED ORDER — DOXYCYCLINE HYCLATE 100 MG PO CAPS: 100.0000 mg | ORAL_CAPSULE | Freq: Two times a day (BID) | ORAL | Status: DC

## 2013-05-20 NOTE — ED Provider Notes (Signed)
Medical screening examination/treatment/procedure(s) were performed by non-physician practitioner and as supervising physician I was immediately available for consultation/collaboration.  Jalicia Roszak, M.D.   Elizardo Chilson C Katie Faraone, MD 05/20/13 2005 

## 2013-05-20 NOTE — ED Provider Notes (Signed)
History     CSN: [id number]  Arrival date & time 05/20/13  1022   First MD Initiated Contact with Patient 05/20/13 1043      Chief Complaint  Patient presents with  . Fever    (Consider location/radiation/quality/duration/timing/severity/associated sxs/prior treatment) HPI  52 yo bm presents with fever and chills x one day.  States that Saturday he was feeling a little fatigued but thought this was related to a lot of work that he had been doing.  Yesterday had a 103 temp along with chills/sweats.  102 at home this morning. Has had a mild headache.  Denies cp, sob, cough, sorethroat, abd pain, bowel/bladder changes, vision changes.  States that he did get bit by a tick about a month ago but has been asymptomatic until the last couple of days.    Past Medical History  Diagnosis Date  . Leukocytopenia     Past Surgical History  Procedure Laterality Date  . Vasectomy  2007  . Appendectomy  2010  . Left neck cyst removal  2010    Family History  Problem Relation Age of Onset  . Breast cancer Paternal Grandmother   . Prostate cancer Maternal Uncle     x 2 uncles  . Heart disease Paternal Grandfather   . Heart disease Maternal Grandmother   . Diabetes Father   . Diabetes Sister     History  Substance Use Topics  . Smoking status: Never Smoker   . Smokeless tobacco: Never Used  . Alcohol Use: Yes     Comment: ocass      Review of Systems  Constitutional: Positive for fever, chills and fatigue.  HENT: Negative.   Eyes: Negative.   Gastrointestinal: Negative.   Endocrine: Negative.   Genitourinary: Negative.   Musculoskeletal: Negative.   Skin: Negative.   Neurological: Negative.   Hematological: Negative.     Allergies  Review of patient's allergies indicates no known allergies.  Home Medications  No current outpatient prescriptions on file.  BP 158/89  Pulse 88  Temp(Src) 102.9 F (39.4 C) (Oral)  Resp 16  SpO2 99%  Physical Exam  Constitutional:  He is oriented to person, place, and time. He appears well-developed.  HENT:  Head: Normocephalic and atraumatic.  Eyes: Conjunctivae are normal. Pupils are equal, round, and reactive to light.  Neck: Normal range of motion.  Cardiovascular: Normal rate, regular rhythm and normal heart sounds.   Pulmonary/Chest: Effort normal and breath sounds normal.  Has a small pin-point scar from the tick bite right upper chest.  No signs of infection.  Nontender.    Abdominal: Soft. Bowel sounds are normal.  Musculoskeletal: Normal range of motion.  Neurological: He is alert and oriented to person, place, and time.  Skin: Skin is warm and dry.  Psychiatric: He has a normal mood and affect.    ED Course  Procedures (including critical care time)  Labs Reviewed  CBC WITH DIFFERENTIAL - Abnormal; Notable for the following:    Platelets 106 (*)    All other components within normal limits  POCT URINALYSIS DIP (DEVICE) - Abnormal; Notable for the following:    Ketones, ur 15 (*)    Hgb urine dipstick SMALL (*)    Protein, ur 100 (*)    Urobilinogen, UA 2.0 (*)    All other components within normal limits  URINE CULTURE  URINALYSIS, DIPSTICK ONLY  ROCKY MTN SPOTTED FVR AB, IGG-BLOOD  ROCKY MTN SPOTTED FVR AB, IGM-BLOOD  SEDIMENTATION RATE  Dg Chest 2 View  05/20/2013   *RADIOLOGY REPORT*  Clinical Data: Fever  CHEST - 2 VIEW  Comparison: 04/01/2009  Findings: The heart is at the upper limits of normal in size. Mediastinal shadows are normal.  The lungs are clear.  No effusions.  No acute bony findings.  IMPRESSION: No active disease.  Heart size at the upper limits of normal.   Original Report Authenticated By: Paulina Fusi, M.D.     No diagnosis found.  Assessment:  Fever of unknown origin.  Question tick bite right upper chest one month ago.   MDM  Patient give script for doxycycline.  Will f/u here in 2 days or see PCP dr fulp.  Instructions discussed and he voices understanding.  If  symptoms worsen, told he must go to ED.      Meds ordered this encounter  Medications  . doxycycline (VIBRAMYCIN) 100 MG capsule    Sig: Take 1 capsule (100 mg total) by mouth every 12 (twelve) hours.    Dispense:  30 capsule    Refill:  0       Zonia Kief, PA-C 05/20/13 1428

## 2013-05-20 NOTE — ED Notes (Addendum)
Pt c/o fevers onset yest w/sxs that include: HA, chills, numbness of fingers of both hands.... Denies: cold sxs, f/v/n/d, urinary sxs... Taking acetaminophen w/no relief.... He is alert and oriented w/no signs of acute distress.

## 2013-05-21 LAB — ROCKY MTN SPOTTED FVR AB, IGM-BLOOD: RMSF IgM: 0.63 IV (ref 0.00–0.89)

## 2013-05-21 LAB — URINE CULTURE
Colony Count: NO GROWTH
Culture: NO GROWTH

## 2013-05-22 ENCOUNTER — Telehealth: Payer: Self-pay | Admitting: *Deleted

## 2013-05-22 NOTE — Telephone Encounter (Signed)
Brooke called at 4:55 pm requesting an appointment for this patient tomorrow with Dr. Gaylyn Rong.  Opened patient's EMR and noted patient has been discharged to PCP.  Dr. Jillyn Hidden is the PCP, saw patient today and his WBC = 1.9.  Asked Nehemiah Settle to send a referral form and patient records to [facility] for this referral for patient to be re-assigned.  Will also inform Dr. Gaylyn Rong of this request for any arrangements/guidance/orders.

## 2013-05-23 ENCOUNTER — Other Ambulatory Visit: Payer: Self-pay | Admitting: Oncology

## 2013-05-23 ENCOUNTER — Telehealth: Payer: Self-pay | Admitting: Oncology

## 2013-05-23 ENCOUNTER — Other Ambulatory Visit: Payer: Self-pay | Admitting: *Deleted

## 2013-05-23 DIAGNOSIS — D72819 Decreased white blood cell count, unspecified: Secondary | ICD-10-CM

## 2013-05-27 ENCOUNTER — Other Ambulatory Visit (HOSPITAL_BASED_OUTPATIENT_CLINIC_OR_DEPARTMENT_OTHER): Payer: BC Managed Care – PPO | Admitting: Lab

## 2013-05-27 ENCOUNTER — Ambulatory Visit (HOSPITAL_BASED_OUTPATIENT_CLINIC_OR_DEPARTMENT_OTHER): Payer: BC Managed Care – PPO | Admitting: Lab

## 2013-05-27 ENCOUNTER — Ambulatory Visit (HOSPITAL_BASED_OUTPATIENT_CLINIC_OR_DEPARTMENT_OTHER): Payer: BC Managed Care – PPO | Admitting: Oncology

## 2013-05-27 ENCOUNTER — Telehealth: Payer: Self-pay | Admitting: Oncology

## 2013-05-27 VITALS — BP 141/91 | HR 60 | Temp 98.6°F | Resp 18 | Ht 69.0 in | Wt 194.3 lb

## 2013-05-27 DIAGNOSIS — D72819 Decreased white blood cell count, unspecified: Secondary | ICD-10-CM

## 2013-05-27 LAB — COMPREHENSIVE METABOLIC PANEL (CC13)
ALT: 130 U/L — ABNORMAL HIGH (ref 0–55)
Albumin: 3.6 g/dL (ref 3.5–5.0)
BUN: 12 mg/dL (ref 7.0–26.0)
Calcium: 8.6 mg/dL (ref 8.4–10.4)
Creatinine: 1 mg/dL (ref 0.7–1.3)
Glucose: 85 mg/dl (ref 70–140)
Potassium: 3.9 mEq/L (ref 3.5–5.1)
Sodium: 140 mEq/L (ref 136–145)
Total Protein: 7.3 g/dL (ref 6.4–8.3)

## 2013-05-27 LAB — CBC WITH DIFFERENTIAL/PLATELET
Basophils Absolute: 0 10*3/uL (ref 0.0–0.1)
EOS%: 1.3 % (ref 0.0–7.0)
Eosinophils Absolute: 0.1 10*3/uL (ref 0.0–0.5)
HCT: 36.3 % — ABNORMAL LOW (ref 38.4–49.9)
HGB: 12.6 g/dL — ABNORMAL LOW (ref 13.0–17.1)
MCH: 30.2 pg (ref 27.2–33.4)
MCV: 87.3 fL (ref 79.3–98.0)
MONO%: 9.4 % (ref 0.0–14.0)
NEUT#: 1.4 10*3/uL — ABNORMAL LOW (ref 1.5–6.5)
NEUT%: 28.6 % — ABNORMAL LOW (ref 39.0–75.0)
RDW: 14 % (ref 11.0–14.6)

## 2013-05-27 LAB — MORPHOLOGY: PLT EST: ADEQUATE

## 2013-05-27 LAB — CHCC SMEAR

## 2013-05-27 LAB — HIV ANTIBODY (ROUTINE TESTING W REFLEX): HIV: NONREACTIVE

## 2013-05-27 NOTE — Telephone Encounter (Signed)
Gave pt appt for md only, 7/18, pt sent top lab today

## 2013-05-27 NOTE — Patient Instructions (Addendum)
1.  Issue:  Slightly depressed neutrophil and slightly elevated lymphocytes. 2.  Potential causes:  Most likely normal variance.  Cannot rule out certain type of chronic leukemia (such as large granular lymphocytic leukemia LGL).  This type of leukemia are very slow growing.  It only needs to be treated if patients with LGL develop severe low blood count, recurrent infection, severe constitutional symptoms.  Also need to rule out HIV.  3.  Recommendation:  *  HIV test today.  *  Referral to Interventional Radiologist for diagnostic bone marrow biopsy and to rule out LGL.  4.  Follow up:    *  In about 3 months to ensure stability of blood count but sooner if concerning finding on bone marrow biopsy.

## 2013-05-27 NOTE — Progress Notes (Signed)
[facility]  Telephone:([phone] [phone]   OFFICE PROGRESS NOTE   Cc:  [name], [name], MD  DIAGNOSIS:  Neutropenia, lymphopenia.   CURRENT THERAPY:  Watchful observation.   INTERVAL HISTORY: Jay Arnold 52 y.o. male returns for regular follow up.  He has been working in the yard in his new house.  He also recently traveled to [place].  He thought that he had a tick bite.  He developed fever to 103F earlier in June 2014.  He was given a course of doxycycline.  He also developed sinus pain and headache.  His PCP switched from doxycycline to Augmentin with resolution of his fever and headache.  Incidentally, his CBC was checked on 05/22/2013 which showed WBC 1.9; Hgb 12.6; plt 97, ANC 0.5; lymph 0.8.  He was kindly referred to the [facility] for evaluation.  Mr. Bungert presented to the clinic today by himself.  He denied fever, anorexia, weight loss, fatigue, headache, visual changes, confusion, drenching night sweats, palpable lymph node swelling, mucositis, odynophagia, dysphagia, nausea vomiting, jaundice, chest pain, palpitation, shortness of breath, dyspnea on exertion, productive cough, gum bleeding, epistaxis, hematemesis, hemoptysis, abdominal pain, abdominal swelling, early satiety, melena, hematochezia, hematuria, skin rash, spontaneous bleeding, joint swelling, joint pain, heat or cold intolerance, bowel bladder incontinence, back pain, focal motor weakness, paresthesia, depression.    Past Medical History  Diagnosis Date  . Leukocytopenia     Past Surgical History  Procedure Laterality Date  . Vasectomy  2007  . Appendectomy  2010  . Left neck cyst removal  2010    Current Outpatient Prescriptions  Medication Sig Dispense Refill  . AMOXICILLIN PO Take by mouth 2 (two) times daily.      Marland Kitchen doxycycline (VIBRAMYCIN) 100 MG capsule Take 1 capsule (100 mg total) by mouth every 12 (twelve) hours.  30 capsule  0   No current  facility-administered medications for this visit.    ALLERGIES:  has No Known Allergies.  REVIEW OF SYSTEMS:  The rest of the 14-point review of system was negative.   Filed Vitals:   05/27/13 1359  BP: 141/91  Pulse: 60  Temp: 98.6 F (37 C)  Resp: 18   Wt Readings from Last 3 Encounters:  05/27/13 194 lb 4.8 oz (88.134 kg)  11/15/12 202 lb 6.4 oz (91.808 kg)  12/23/11 200 lb (90.719 kg)   ECOG Performance status: 0  PHYSICAL EXAMINATION:   General:  well-nourished man, in no acute distress.  Eyes:  no scleral icterus.  ENT:  There were no oropharyngeal lesions.  Neck was without thyromegaly.  Lymphatics:  Negative cervical, supraclavicular or axillary adenopathy.  Respiratory: lungs were clear bilaterally without wheezing or crackles.  Cardiovascular:  Regular rate and rhythm, S1/S2, without murmur, rub or gallop.  There was no pedal edema.  GI:  abdomen was soft, flat, nontender, nondistended, without organomegaly.  Muscoloskeletal:  no spinal tenderness of palpation of vertebral spine.  Skin exam was without echymosis, petichae.  Neuro exam was nonfocal.  Patient was able to get on and off exam table without assistance.  Gait was normal.  Patient was alert and oriented.  Attention was good.   Language was appropriate.  Mood was normal without depression.  Speech was not pressured.  Thought content was not tangential.      LABORATORY/RADIOLOGY DATA:  Lab Results  Component Value Date   WBC 4.9 05/27/2013   HGB 12.6* 05/27/2013   HCT 36.3* 05/27/2013   PLT 266  05/27/2013   GLUCOSE 85 05/27/2013   ALKPHOS 47 05/27/2013   ALT 130* 05/27/2013   AST 81* 05/27/2013   NA 140 05/27/2013   K 3.9 05/27/2013   CL 100 05/20/2013   CREATININE 1.0 05/27/2013   BUN 12.0 05/27/2013   CO2 25 05/27/2013    Dg Chest 2 View  05/20/2013   *RADIOLOGY REPORT*  Clinical Data: Fever  CHEST - 2 VIEW  Comparison: 04/01/2009  Findings: The heart is at the upper limits of normal in size. Mediastinal shadows  are normal.  The lungs are clear.  No effusions.  No acute bony findings.  IMPRESSION: No active disease.  Heart size at the upper limits of normal.   Original Report Authenticated By: Paulina Fusi, M.D.       ASSESSMENT AND PLAN:   1.  Issue:  Slightly depressed neutrophil and slightly elevated lymphocytes. 2.  Potential causes:  Most likely due to antibiotic use which caused transient leukopenia and thrombocytopenia which have significantly improved today. I cannot rule out certain type of chronic leukemia (such as large granular lymphocytic leukemia LGL).  This type of leukemia are very slow growing.  It only needs to be treated if patients with LGL develop severe low blood count, recurrent infection, severe constitutional symptoms.  Also need to rule out HIV.  3.  Recommendation:  *  HIV test today.  *  Referral to Interventional Radiologist for diagnostic bone marrow biopsy and to rule out LGL.  Mr. Holderman expressed informed understanding and agreed to proceed with diagnostic bone marrow biopsy.   4.  Follow up:    *  In about 3 months to ensure stability of blood count but sooner if concerning finding on bone marrow biopsy.    I informed Mr. Teaster that I am leaving the practice.  [facility] will arrange for him to see another provider when he returns.     The length of time of the face-to-face encounter was 25 minutes. More than 50% of time was spent counseling and coordination of care.

## 2013-05-29 ENCOUNTER — Encounter (HOSPITAL_COMMUNITY): Payer: Self-pay | Admitting: Pharmacy Technician

## 2013-05-30 ENCOUNTER — Other Ambulatory Visit: Payer: Self-pay | Admitting: Radiology

## 2013-06-04 ENCOUNTER — Encounter (HOSPITAL_COMMUNITY): Payer: Self-pay

## 2013-06-04 ENCOUNTER — Ambulatory Visit (HOSPITAL_COMMUNITY)
Admission: RE | Admit: 2013-06-04 | Discharge: 2013-06-04 | Disposition: A | Discharge status: Home / Self Care | Payer: BC Managed Care – PPO | Source: Ambulatory Visit | Attending: Oncology | Admitting: Oncology

## 2013-06-04 DIAGNOSIS — D709 Neutropenia, unspecified: Secondary | ICD-10-CM | POA: Insufficient documentation

## 2013-06-04 DIAGNOSIS — D72819 Decreased white blood cell count, unspecified: Secondary | ICD-10-CM

## 2013-06-04 LAB — CBC
MCH: 29.6 pg (ref 26.0–34.0)
MCV: 88.3 fL (ref 78.0–100.0)
Platelets: 267 10*3/uL (ref 150–400)
RDW: 13.9 % (ref 11.5–15.5)

## 2013-06-04 MED ORDER — SODIUM CHLORIDE 0.9 % IV SOLN
Freq: Once | INTRAVENOUS | Status: AC
  Administered 2013-06-04: 07:00:00 via INTRAVENOUS

## 2013-06-04 MED ORDER — MIDAZOLAM HCL 2 MG/2ML IJ SOLN
INTRAMUSCULAR | Status: AC
  Filled 2013-06-04: qty 6

## 2013-06-04 MED ORDER — FENTANYL CITRATE 0.05 MG/ML IJ SOLN
INTRAMUSCULAR | Status: AC
  Filled 2013-06-04: qty 6

## 2013-06-04 MED ORDER — HYDROCODONE-ACETAMINOPHEN 5-325 MG PO TABS: 1.0000 | ORAL_TABLET | ORAL | Status: DC | PRN

## 2013-06-04 MED ORDER — FENTANYL CITRATE 0.05 MG/ML IJ SOLN
INTRAMUSCULAR | Status: AC | PRN
  Administered 2013-06-04: 100 ug via INTRAVENOUS
  Administered 2013-06-04: 50 ug via INTRAVENOUS

## 2013-06-04 MED ORDER — MIDAZOLAM HCL 2 MG/2ML IJ SOLN
INTRAMUSCULAR | Status: AC | PRN
  Administered 2013-06-04: 1 mg via INTRAVENOUS

## 2013-06-04 NOTE — H&P (Signed)
Chief Complaint: "I'm here for a bone marrow biopsy" Referring Physician:Ha HPI: Jay Arnold is an 52 y.o. male with neutropenia and leukopenia. He is scheduled today for bone marrow biopsy. PMHx and meds reviewed. Feels well today otherwise.  Past Medical History:  Past Medical History  Diagnosis Date  . Leukocytopenia     Past Surgical History:  Past Surgical History  Procedure Laterality Date  . Vasectomy  2007  . Appendectomy  2010  . Left neck cyst removal  2010    Family History:  Family History  Problem Relation Age of Onset  . Breast cancer Paternal Grandmother   . Prostate cancer Maternal Uncle     x 2 uncles  . Heart disease Paternal Grandfather   . Heart disease Maternal Grandmother   . Diabetes Father   . Diabetes Sister     Social History:  reports that he has never smoked. He has never used smokeless tobacco. He reports that  drinks alcohol. He reports that he does not use illicit drugs.  Allergies: No Known Allergies  Medications: amoxicillin (AMOXIL) 875 MG tablet (Taking) Sig - Route: Take 875 mg by mouth 2 (two) times daily.  doxycycline (VIBRAMYCIN) 100 MG capsule (Taking) 30 capsule 0 05/20/2013 Sig - Route: Take 1 capsule (100 mg total) by mouth every 12 (twelve) hours. -   Please HPI for pertinent positives, otherwise complete 10 system ROS negative.  Physical Exam: BP 125/87  Pulse 63  Temp(Src) 98 F (36.7 C) (Oral)  Resp 18  SpO2 100% There is no weight on file to calculate BMI.   General Appearance:  Alert, cooperative, no distress, appears stated age  Head:  Normocephalic, without obvious abnormality, atraumatic  ENT: Unremarkable  Neck: Supple, symmetrical, trachea midline  Lungs:   Clear to auscultation bilaterally, no w/r/r, respirations unlabored without use of accessory muscles.  Chest Wall:  No tenderness or deformity  Heart:  Regular rate and rhythm, S1, S2 normal, no murmur, rub or gallop.  Neurologic: Normal affect, no  gross deficits.   Results for orders placed during the hospital encounter of 06/04/13 (from the past 48 hour(s))  APTT     Status: None   Collection Time    06/04/13  7:15 AM      Result Value Range   aPTT 30  24 - 37 seconds  CBC     Status: Abnormal   Collection Time    06/04/13  7:15 AM      Result Value Range   WBC 3.5 (*) 4.0 - 10.5 K/uL   RBC 4.46  4.22 - 5.81 MIL/uL   Hemoglobin 13.2  13.0 - 17.0 g/dL   HCT 11.9  14.7 - 82.9 %   MCV 88.3  78.0 - 100.0 fL   MCH 29.6  26.0 - 34.0 pg   MCHC 33.5  30.0 - 36.0 g/dL   RDW 56.2  13.0 - 86.5 %   Platelets 267  150 - 400 K/uL  PROTIME-INR     Status: None   Collection Time    06/04/13  7:15 AM      Result Value Range   Prothrombin Time 13.2  11.6 - 15.2 seconds   INR 1.02  0.00 - 1.49   No results found.  Assessment/Plan Leukopenia, neutropenia For BM biopsy today Discussed procedure, risks, complications, use of sedation Labs reviewed. Consent signed in chart  Brayton El PA-C 06/04/2013, 8:42 AM

## 2013-06-04 NOTE — Procedures (Signed)
Successful CT guided bone marrow biopsy.  No immediate complication.   

## 2013-06-13 ENCOUNTER — Encounter: Payer: Self-pay | Admitting: Oncology

## 2013-06-13 ENCOUNTER — Other Ambulatory Visit: Payer: Self-pay | Admitting: Oncology

## 2013-06-13 NOTE — Progress Notes (Signed)
I called and talked with patient about the result of the recent bone marrow biopsy.    There is no evidence of leukemia, MDS.  Your white blood cell count is low which is most likely due to normal variation.  If he develos recurrent infection, we may consider Neulasta.  However, he is asymptomatic at this time. He agreed with watchful observation.

## 2013-06-14 ENCOUNTER — Ambulatory Visit: Payer: BC Managed Care – PPO | Admitting: Oncology

## 2013-06-23 NOTE — Progress Notes (Signed)
No show.  I did however talked with patient regarding the result of his bone marrow biopsy that was negative.  I rescheduled his visit to about 4 months.

## 2013-06-24 ENCOUNTER — Telehealth: Payer: Self-pay | Admitting: Oncology

## 2013-06-24 NOTE — Telephone Encounter (Signed)
lvm for pt regarding to Nov appt...mailed pt avs/appt sched and letter

## 2013-10-13 ENCOUNTER — Other Ambulatory Visit: Payer: Self-pay | Admitting: Hematology and Oncology

## 2013-10-13 DIAGNOSIS — D72819 Decreased white blood cell count, unspecified: Secondary | ICD-10-CM

## 2013-10-14 ENCOUNTER — Ambulatory Visit: Payer: BC Managed Care – PPO | Admitting: Hematology and Oncology

## 2013-10-14 ENCOUNTER — Other Ambulatory Visit: Payer: BC Managed Care – PPO | Admitting: Lab

## 2013-10-15 ENCOUNTER — Encounter: Payer: Self-pay | Admitting: *Deleted

## 2013-10-18 ENCOUNTER — Telehealth: Payer: Self-pay | Admitting: Hematology and Oncology

## 2013-10-18 NOTE — Telephone Encounter (Signed)
pt called to rs missed appt shh

## 2013-10-21 ENCOUNTER — Other Ambulatory Visit (HOSPITAL_BASED_OUTPATIENT_CLINIC_OR_DEPARTMENT_OTHER): Payer: BC Managed Care – PPO | Admitting: Lab

## 2013-10-21 ENCOUNTER — Encounter (INDEPENDENT_AMBULATORY_CARE_PROVIDER_SITE_OTHER): Payer: Self-pay

## 2013-10-21 ENCOUNTER — Encounter: Payer: Self-pay | Admitting: Hematology and Oncology

## 2013-10-21 ENCOUNTER — Ambulatory Visit (HOSPITAL_BASED_OUTPATIENT_CLINIC_OR_DEPARTMENT_OTHER): Payer: BC Managed Care – PPO | Admitting: Hematology and Oncology

## 2013-10-21 VITALS — BP 159/92 | HR 60 | Temp 98.5°F | Resp 18 | Ht 69.0 in | Wt 200.7 lb

## 2013-10-21 DIAGNOSIS — D72819 Decreased white blood cell count, unspecified: Secondary | ICD-10-CM

## 2013-10-21 LAB — CBC WITH DIFFERENTIAL/PLATELET
Basophils Absolute: 0 10*3/uL (ref 0.0–0.1)
EOS%: 2 % (ref 0.0–7.0)
HCT: 40.8 % (ref 38.4–49.9)
HGB: 13.4 g/dL (ref 13.0–17.1)
LYMPH%: 35.7 % (ref 14.0–49.0)
MCH: 29.4 pg (ref 27.2–33.4)
MCV: 89.5 fL (ref 79.3–98.0)
MONO#: 0.5 10*3/uL (ref 0.1–0.9)
MONO%: 11.6 % (ref 0.0–14.0)
NEUT%: 49.8 % (ref 39.0–75.0)
Platelets: 185 10*3/uL (ref 140–400)
RBC: 4.56 10*6/uL (ref 4.20–5.82)
WBC: 4 10*3/uL (ref 4.0–10.3)
lymph#: 1.4 10*3/uL (ref 0.9–3.3)

## 2013-10-21 LAB — COMPREHENSIVE METABOLIC PANEL (CC13)
ALT: 29 U/L (ref 0–55)
AST: 30 U/L (ref 5–34)
BUN: 14.8 mg/dL (ref 7.0–26.0)
Calcium: 9 mg/dL (ref 8.4–10.4)
Chloride: 107 mEq/L (ref 98–109)
Creatinine: 1.1 mg/dL (ref 0.7–1.3)
Sodium: 142 mEq/L (ref 136–145)
Total Bilirubin: 0.55 mg/dL (ref 0.20–1.20)

## 2013-10-21 NOTE — Progress Notes (Signed)
[facility] OFFICE PROGRESS NOTE  Jay Arnold, [name], MD DIAGNOSIS:  Chronic leukopenia, resolved  SUMMARY OF HEMATOLOGIC HISTORY: This is a pleasant 52 year old African American male who is being referred here because of abnormal CBC. In July of 2014, he had bone marrow aspirate and biopsy that came back negative.\ INTERVAL HISTORY: Jay Arnold 52 y.o. male returns for further followup. He is feeling well. Denies any recent infection. He denies any recent fever, chills, night sweats or abnormal weight loss  I have reviewed the past medical history, past surgical history, social history and family history with the patient and they are unchanged from previous note.  ALLERGIES:  has No Known Allergies.  MEDICATIONS:  No current outpatient prescriptions on file.   No current facility-administered medications for this visit.     REVIEW OF SYSTEMS:   Constitutional: Denies fevers, chills or night sweats Behavioral/Psych: Mood is stable, no new changes  All other systems were reviewed with the patient and are negative.  PHYSICAL EXAMINATION: ECOG PERFORMANCE STATUS: 0 - Asymptomatic  Filed Vitals:   10/21/13 1241  BP: 159/92  Pulse: 60  Temp: 98.5 F (36.9 C)  Resp: 18   Filed Weights   10/21/13 1241  Weight: 200 lb 11.2 oz (91.037 kg)    GENERAL:alert, no distress and comfortable Musculoskeletal:no cyanosis of digits and no clubbing  NEURO: alert & oriented x 3 with fluent speech, no focal motor/sensory deficits  LABORATORY DATA:  I have reviewed the data as listed Results for orders placed in visit on 10/21/13 (from the past 48 hour(s))  CBC WITH DIFFERENTIAL     Status: None   Collection Time    10/21/13 12:26 PM      Result Value Range   WBC 4.0  4.0 - 10.3 10e3/uL   NEUT# 2.0  1.5 - 6.5 10e3/uL   HGB 13.4  13.0 - 17.1 g/dL   HCT 47.8  29.5 - 62.1 %   Platelets 185  140 - 400 10e3/uL   MCV 89.5  79.3 - 98.0 fL   MCH 29.4  27.2 - 33.4 pg   MCHC  32.9  32.0 - 36.0 g/dL   RBC 3.08  6.57 - 8.46 10e6/uL   RDW 14.2  11.0 - 14.6 %   lymph# 1.4  0.9 - 3.3 10e3/uL   MONO# 0.5  0.1 - 0.9 10e3/uL   Eosinophils Absolute 0.1  0.0 - 0.5 10e3/uL   Basophils Absolute 0.0  0.0 - 0.1 10e3/uL   NEUT% 49.8  39.0 - 75.0 %   LYMPH% 35.7  14.0 - 49.0 %   MONO% 11.6  0.0 - 14.0 %   EOS% 2.0  0.0 - 7.0 %   BASO% 0.9  0.0 - 2.0 %    Lab Results  Component Value Date   WBC 4.0 10/21/2013   HGB 13.4 10/21/2013   HCT 40.8 10/21/2013   MCV 89.5 10/21/2013   PLT 185 10/21/2013   ASSESSMENT & PLAN:  #1 leukopenia, resolved This is a pleasant 52 year old gentleman who is being referred here because of chronic leukopenia which subsequently resolved spontaneously. He has family history of leukopenia. I suspect the cause of his leukopenia is due to his African American heritage. Patient like him typically has chronic fluctuation of white count and they usually remain asymptomatic. I reassured the patient the bone marrow aspirate and biopsy was negative. He does not need to come back here for further evaluation in the future. If his CBC came back  normal, unless the patient is symptomatic, I recommend observation only and redraw CBC within this for 6 weeks. Gave him my business card. I will be glad to see him back in the future if needed.  All questions were answered. The patient knows to call the clinic with any problems, questions or concerns. No barriers to learning was detected.  I spent 15 minutes counseling the patient face to face. The total time spent in the appointment was 20 minutes and more than 50% was on counseling.     [facility], Audrianna Driskill, MD 10/21/2013 12:57 PM

## 2014-11-26 IMAGING — CR DG CHEST 2V
2 series · 2 of 2 positions shown · non-contrast
Comparison: 04/01/2009

CLINICAL DATA: Fever

CHEST - 2 VIEW

[view not recorded (1 of 2)]
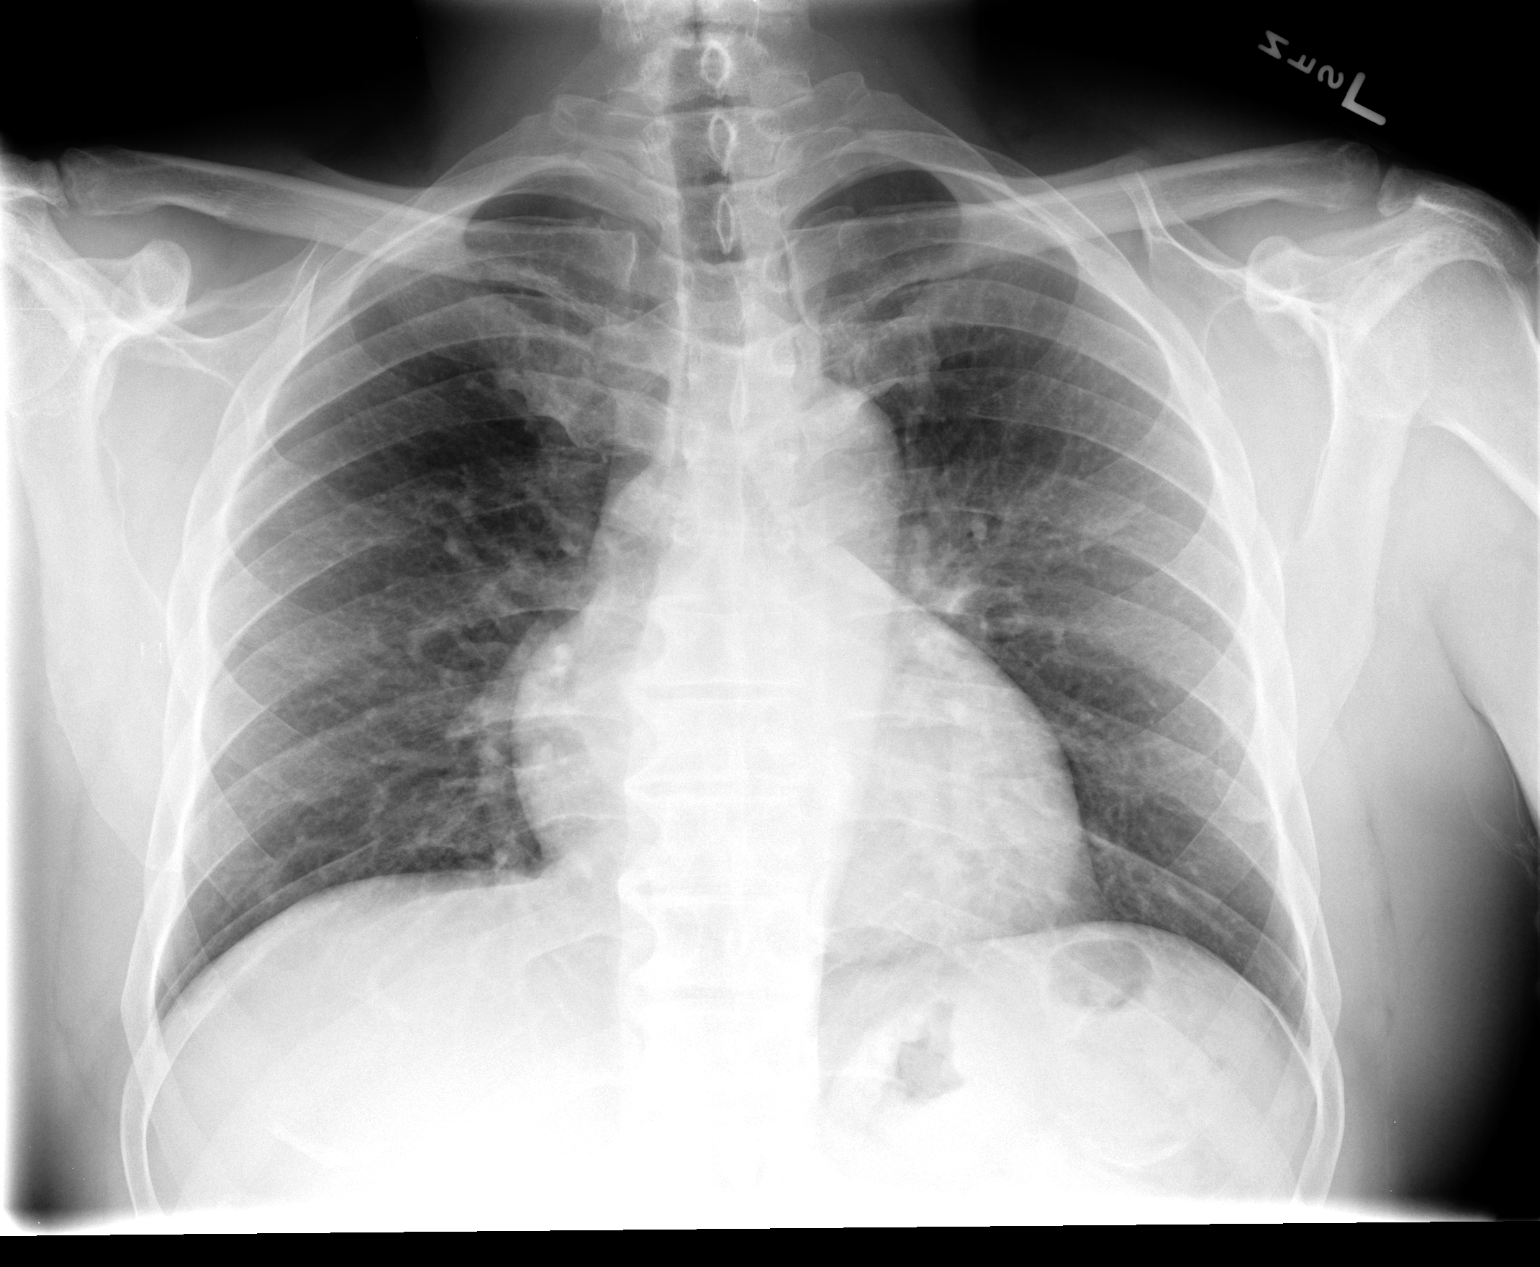

[view not recorded (2 of 2)]
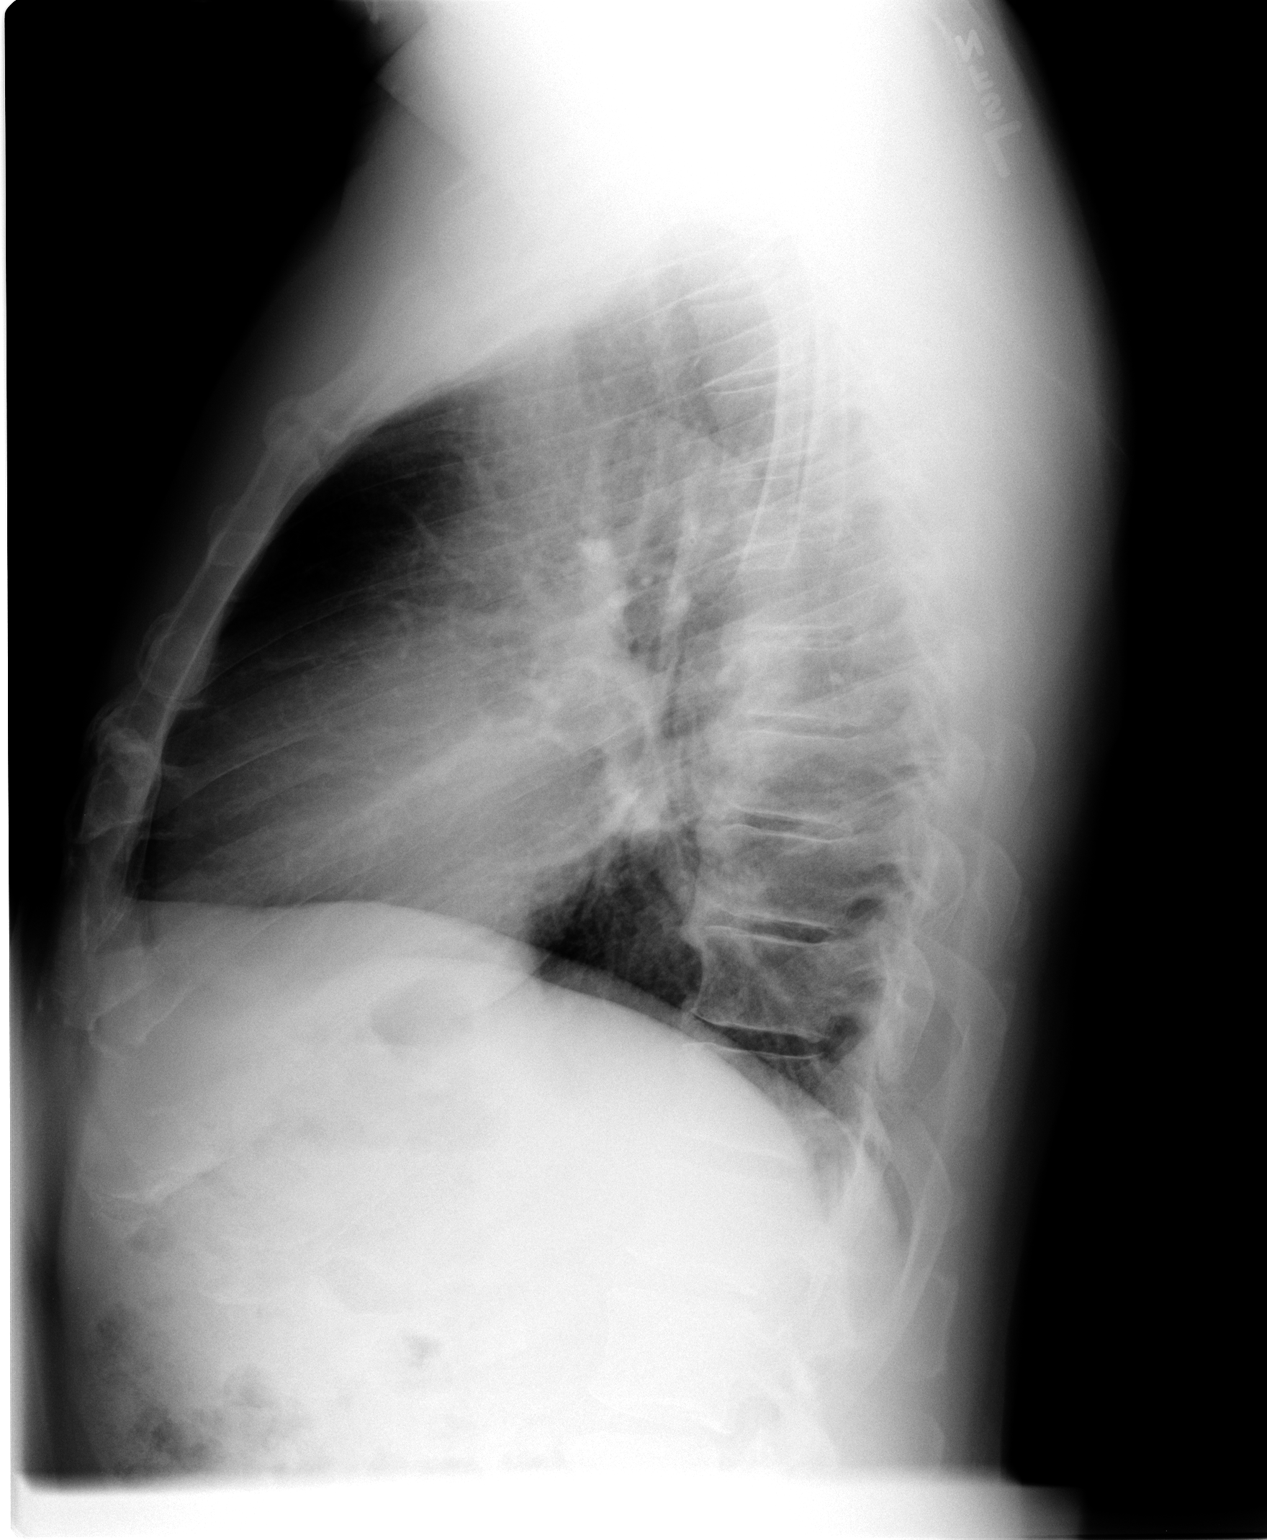

[2 of 2 positions shown; findings below may reference images not displayed]

FINDINGS: The heart is at the upper limits of normal in size.
Mediastinal shadows are normal.  The lungs are clear.  No
effusions.  No acute bony findings.
IMPRESSION: No active disease.  Heart size at the upper limits of normal.

## 2014-12-11 IMAGING — CT CT BIOPSY
2 series · 4 of 8 positions shown, 7 images · non-contrast
Comparison: none

CLINICAL HISTORY: 52-year-old with neutropenia.

[Series 3: add scan 5.0 b65f · axial · 0.67mm/px · z∈[-162,-158]mm · 2 of 4 slices shown, 5 images (1 of 2)]
[im 2/4  soft-tissue]
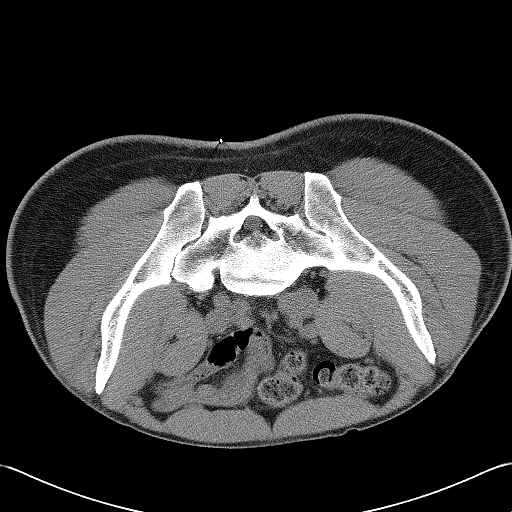
[im 2/4  lung]
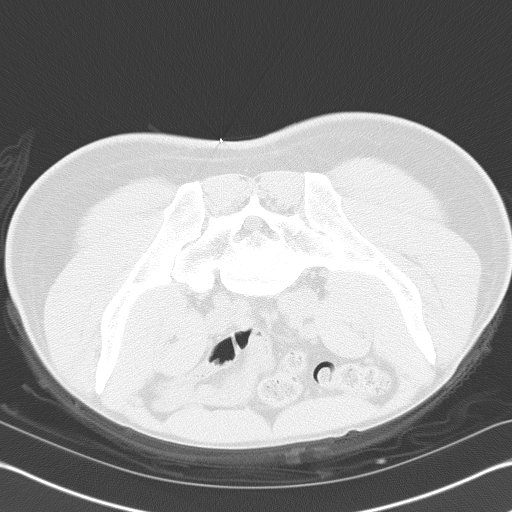
[im 2/4  bone]
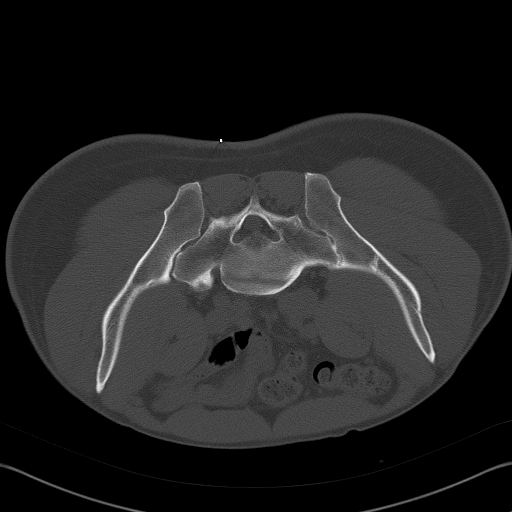
[im 3/4  soft-tissue]
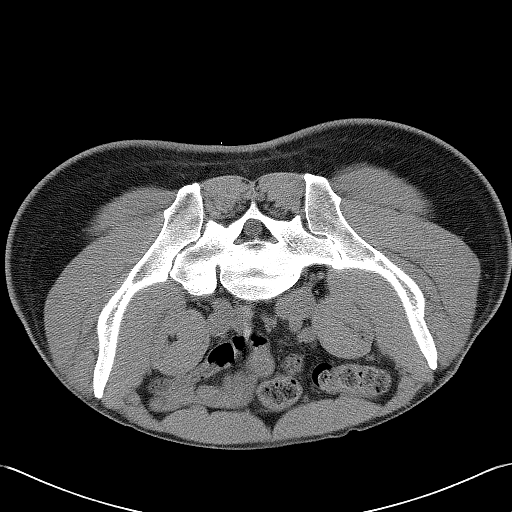
[im 3/4  lung]
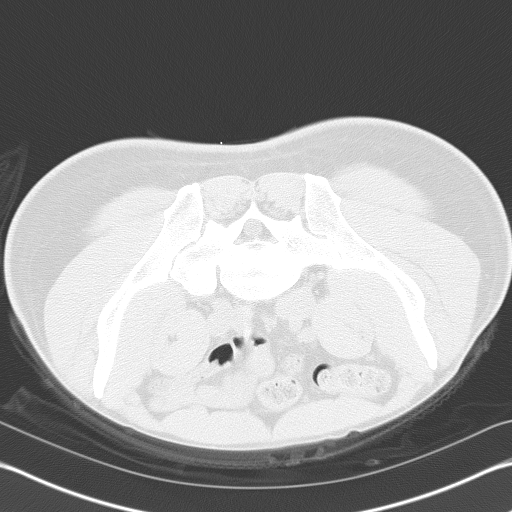

[Series 4: add scan 5.0 b65f · axial · 0.67mm/px · z∈[-162,-158]mm · 2 of 4 slices shown (2 of 2)]
[im 2/4  soft-tissue]
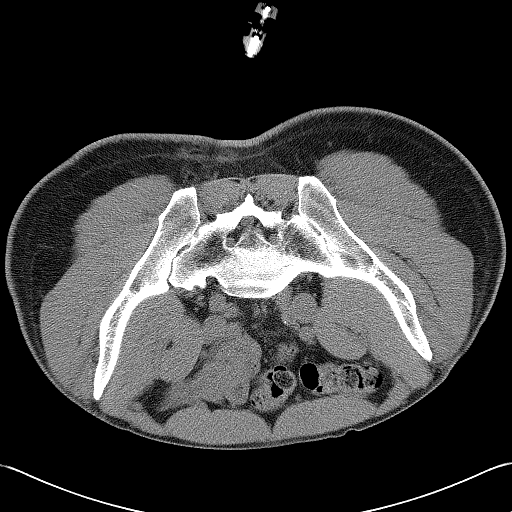
[im 3/4  soft-tissue]
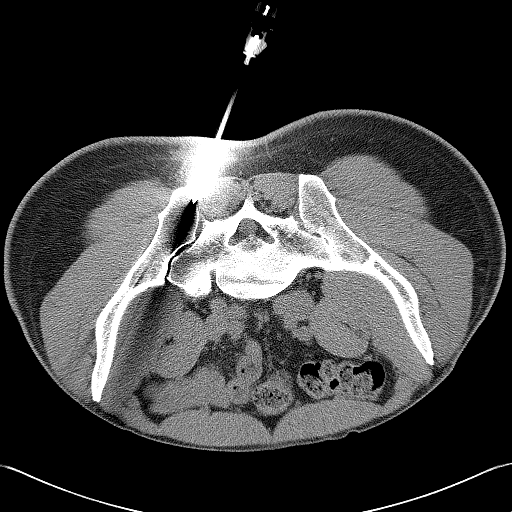

[4 of 8 positions shown; findings below may reference images not displayed]

PROCEDURE(S): CT GUIDED BONE MARROW ASPIRATES AND BIOPSY

 Medications: Versed 2 mg, Fentanyl 150 mcg.  A radiology nurse
monitored the patient for moderate sedation.

Sedation time:  10 minutes

 Procedure: The procedure was explained to the patient. The risks
and benefits of the procedure were discussed and the patient's
questions were addressed.  Informed consent was obtained from the
patient. The patient was placed prone on CT scan. Images of the
pelvis were obtained. The right side of back was prepped and draped
in sterile fashion. The skin and right posterior iliac bone were
anesthetized with 1% lidocaine.   11 gauge bone needle was directed
into the right iliac bone with CT guidance.  Two aspirates and one
core biopsy obtained.
FINDINGS: Needle directed into the posterior right iliac bone.

Complications: None
IMPRESSION: CT guided bone marrow aspirates and core biopsy.

## 2015-09-30 ENCOUNTER — Telehealth: Payer: Self-pay | Admitting: Hematology and Oncology

## 2015-09-30 NOTE — Telephone Encounter (Signed)
s.w. pt and gv new appt.Marland KitchenMarland KitchenMarland KitchenMarland Kitchenpt ok and aware of new d.t

## 2015-11-12 ENCOUNTER — Other Ambulatory Visit: Payer: Self-pay | Admitting: Hematology and Oncology

## 2015-11-12 ENCOUNTER — Encounter: Payer: Self-pay | Admitting: Hematology and Oncology

## 2015-11-12 DIAGNOSIS — D72819 Decreased white blood cell count, unspecified: Secondary | ICD-10-CM

## 2015-11-12 HISTORY — DX: Decreased white blood cell count, unspecified: D72.819

## 2015-11-16 ENCOUNTER — Other Ambulatory Visit (HOSPITAL_BASED_OUTPATIENT_CLINIC_OR_DEPARTMENT_OTHER): Payer: BLUE CROSS/BLUE SHIELD

## 2015-11-16 ENCOUNTER — Ambulatory Visit (HOSPITAL_BASED_OUTPATIENT_CLINIC_OR_DEPARTMENT_OTHER): Payer: BLUE CROSS/BLUE SHIELD | Admitting: Hematology and Oncology

## 2015-11-16 ENCOUNTER — Encounter: Payer: Self-pay | Admitting: Hematology and Oncology

## 2015-11-16 VITALS — BP 147/92 | HR 65 | Temp 98.0°F | Resp 18 | Ht 69.0 in | Wt 203.0 lb

## 2015-11-16 DIAGNOSIS — Z23 Encounter for immunization: Secondary | ICD-10-CM | POA: Diagnosis not present

## 2015-11-16 DIAGNOSIS — D72819 Decreased white blood cell count, unspecified: Secondary | ICD-10-CM | POA: Diagnosis not present

## 2015-11-16 DIAGNOSIS — Z299 Encounter for prophylactic measures, unspecified: Secondary | ICD-10-CM

## 2015-11-16 LAB — CBC WITH DIFFERENTIAL/PLATELET
BASO%: 0.8 % (ref 0.0–2.0)
Basophils Absolute: 0 10*3/uL (ref 0.0–0.1)
EOS%: 3 % (ref 0.0–7.0)
Eosinophils Absolute: 0.1 10*3/uL (ref 0.0–0.5)
HCT: 41.3 % (ref 38.4–49.9)
HEMOGLOBIN: 13.7 g/dL (ref 13.0–17.1)
LYMPH%: 39.5 % (ref 14.0–49.0)
MCH: 29.6 pg (ref 27.2–33.4)
MCHC: 33.1 g/dL (ref 32.0–36.0)
MCV: 89.4 fL (ref 79.3–98.0)
MONO#: 0.4 10*3/uL (ref 0.1–0.9)
MONO%: 12 % (ref 0.0–14.0)
NEUT%: 44.7 % (ref 39.0–75.0)
NEUTROS ABS: 1.3 10*3/uL — AB (ref 1.5–6.5)
PLATELETS: 169 10*3/uL (ref 140–400)
RBC: 4.62 10*6/uL (ref 4.20–5.82)
RDW: 13.9 % (ref 11.0–14.6)
WBC: 3 10*3/uL — AB (ref 4.0–10.3)
lymph#: 1.2 10*3/uL (ref 0.9–3.3)

## 2015-11-16 LAB — MORPHOLOGY: PLT EST: ADEQUATE

## 2015-11-16 MED ORDER — INFLUENZA VAC SPLIT QUAD 0.5 ML IM SUSY
0.5000 mL | PREFILLED_SYRINGE | Freq: Once | INTRAMUSCULAR | Status: AC
  Administered 2015-11-16: 0.5 mL via INTRAMUSCULAR
  Filled 2015-11-16: qty 0.5

## 2015-11-16 NOTE — Progress Notes (Signed)
[facility] NOTE  [name], [name], MD SUMMARY OF HEMATOLOGIC HISTORY: This is a pleasant 54 year old African American male who is being referred here because of abnormal CBC. In July of 2014, he had bone marrow aspirate and biopsy that came back negative. INTERVAL HISTORY: Jay Arnold 54 y.o. male returns for further follow-up. He feels well. Denies recent infection. Denies any fevers, chills, night sweats or abnormal weight loss.  I have reviewed the past medical history, past surgical history, social history and family history with the patient and they are unchanged from previous note.  ALLERGIES:  has No Known Allergies.  MEDICATIONS:  No current outpatient prescriptions on file.   No current facility-administered medications for this visit.     REVIEW OF SYSTEMS:   Constitutional: Denies fevers, chills or night sweats Eyes: Denies blurriness of vision Ears, nose, mouth, throat, and face: Denies mucositis or sore throat Respiratory: Denies cough, dyspnea or wheezes Cardiovascular: Denies palpitation, chest discomfort or lower extremity swelling Gastrointestinal:  Denies nausea, heartburn or change in bowel habits Skin: Denies abnormal skin rashes Lymphatics: Denies new lymphadenopathy or easy bruising Neurological:Denies numbness, tingling or new weaknesses Behavioral/Psych: Mood is stable, no new changes  All other systems were reviewed with the patient and are negative.  PHYSICAL EXAMINATION: ECOG PERFORMANCE STATUS: 0 - Asymptomatic  Filed Vitals:   11/16/15 0819  BP: 147/92  Pulse: 65  Temp: 98 F (36.7 C)  Resp: 18   Filed Weights   11/16/15 0819  Weight: 203 lb (92.08 kg)    GENERAL:alert, no distress and comfortable SKIN: skin color, texture, turgor are normal, no rashes or significant lesions EYES: normal, Conjunctiva are pink and non-injected, sclera clear OROPHARYNX:no exudate, no erythema and lips, buccal mucosa, and  tongue normal  NECK: supple, thyroid normal size, non-tender, without nodularity LYMPH:  no palpable lymphadenopathy in the cervical, axillary or inguinal LUNGS: clear to auscultation and percussion with normal breathing effort HEART: regular rate & rhythm and no murmurs and no lower extremity edema ABDOMEN:abdomen soft, non-tender and normal bowel sounds Musculoskeletal:no cyanosis of digits and no clubbing  NEURO: alert & oriented x 3 with fluent speech, no focal motor/sensory deficits  LABORATORY DATA:  I have reviewed the data as listed Results for orders placed or performed in visit on 11/16/15 (from the past 48 hour(s))  CBC with Differential/Platelet     Status: Abnormal   Collection Time: 11/16/15  8:06 AM  Result Value Ref Range   WBC 3.0 (L) 4.0 - 10.3 10e3/uL   NEUT# 1.3 (L) 1.5 - 6.5 10e3/uL   HGB 13.7 13.0 - 17.1 g/dL   HCT 41.3 38.4 - 49.9 %   Platelets 169 140 - 400 10e3/uL   MCV 89.4 79.3 - 98.0 fL   MCH 29.6 27.2 - 33.4 pg   MCHC 33.1 32.0 - 36.0 g/dL   RBC 4.62 4.20 - 5.82 10e6/uL   RDW 13.9 11.0 - 14.6 %   lymph# 1.2 0.9 - 3.3 10e3/uL   MONO# 0.4 0.1 - 0.9 10e3/uL   Eosinophils Absolute 0.1 0.0 - 0.5 10e3/uL   Basophils Absolute 0.0 0.0 - 0.1 10e3/uL   NEUT% 44.7 39.0 - 75.0 %   LYMPH% 39.5 14.0 - 49.0 %   MONO% 12.0 0.0 - 14.0 %   EOS% 3.0 0.0 - 7.0 %   BASO% 0.8 0.0 - 2.0 %  Morphology     Status: None   Collection Time: 11/16/15  8:06 AM  Result Value  Ref Range   Ovalocytes Few Negative   White Cell Comments C/W auto diff    PLT EST Adequate Adequate    Lab Results  Component Value Date   WBC 3.0* 11/16/2015   HGB 13.7 11/16/2015   HCT 41.3 11/16/2015   MCV 89.4 11/16/2015   PLT 169 11/16/2015   ASSESSMENT & PLAN:  Leukopenia As I mentioned to the patient before, 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 His blood work fluctuated up and now over the past few years and he is completely  asymptomatic. He does not need long-term follow-up. He had underwent extensive evaluation in 2014 including bone marrow biopsy and that was negative for myelodysplastic syndrome. I will see him as needed only in the future.  Preventive measure We discussed the importance of preventive care and reviewed the vaccination programs. He does not have any prior allergic reactions to influenza vaccination. He agrees to proceed with influenza vaccination today and we will administer it today at the clinic.    All questions were answered. The patient knows to call the clinic with any problems, questions or concerns. No barriers to learning was detected.  I spent 15 minutes counseling the patient face to face. The total time spent in the appointment was 20 minutes and more than 50% was on counseling.     [facility], Dequita Schleicher, MD 12/19/20169:22 AM

## 2015-11-16 NOTE — Assessment & Plan Note (Signed)
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 His blood work fluctuated up and now over the past few years and he is completely asymptomatic. He does not need long-term follow-up. He had underwent extensive evaluation in 2014 including bone marrow biopsy and that was negative for myelodysplastic syndrome. I will see him as needed only in the future. 

## 2015-11-16 NOTE — Assessment & Plan Note (Signed)
We discussed the importance of preventive care and reviewed the vaccination programs. He does not have any prior allergic reactions to influenza vaccination. He agrees to proceed with influenza vaccination today and we will administer it today at the clinic.  

## 2016-10-06 ENCOUNTER — Encounter: Payer: Self-pay | Admitting: Gastroenterology

## 2016-11-28 HISTORY — PX: COLONOSCOPY: SHX174

## 2017-02-21 ENCOUNTER — Encounter: Payer: Self-pay | Admitting: Gastroenterology

## 2017-04-07 ENCOUNTER — Ambulatory Visit (AMBULATORY_SURGERY_CENTER): Payer: Self-pay | Admitting: *Deleted

## 2017-04-07 VITALS — Ht 69.0 in | Wt 206.4 lb

## 2017-04-07 DIAGNOSIS — Z86010 Personal history of colonic polyps: Secondary | ICD-10-CM

## 2017-04-07 MED ORDER — NA SULFATE-K SULFATE-MG SULF 17.5-3.13-1.6 GM/177ML PO SOLN: ORAL | 0 refills | Status: DC

## 2017-04-07 NOTE — Progress Notes (Signed)
No allergies to eggs or soy. No problems with anesthesia.  Pt given Emmi instructions for colonoscopy  No oxygen use  No diet drug use  

## 2017-04-10 ENCOUNTER — Encounter: Payer: Self-pay | Admitting: Gastroenterology

## 2017-04-21 ENCOUNTER — Encounter: Payer: Self-pay | Admitting: Gastroenterology

## 2017-04-21 ENCOUNTER — Ambulatory Visit (AMBULATORY_SURGERY_CENTER): Payer: BLUE CROSS/BLUE SHIELD | Admitting: Gastroenterology

## 2017-04-21 VITALS — BP 143/89 | HR 55 | Temp 97.7°F | Resp 19 | Ht 69.0 in | Wt 206.0 lb

## 2017-04-21 DIAGNOSIS — Z86010 Personal history of colonic polyps: Secondary | ICD-10-CM | POA: Diagnosis present

## 2017-04-21 MED ORDER — SODIUM CHLORIDE 0.9 % IV SOLN: 500.0000 mL | INTRAVENOUS | Status: DC

## 2017-04-21 NOTE — Progress Notes (Signed)
Report to PACU, RN, vss, BBS= Clear.  

## 2017-04-21 NOTE — Op Note (Signed)
[facility] Patient Name: Jay Arnold Procedure Date: 04/21/2017 10:49 AM MRN: [id number] Endoscopist: Ladene Artist , MD Age: 56 Referring MD:  Date of Birth: 10-May-1961 Gender: Male Account #: [id number] Procedure:                Colonoscopy Indications:              Surveillance: Personal history of adenomatous                            polyps on last colonoscopy > 5 years ago Medicines:                Monitored Anesthesia Care Procedure:                Pre-Anesthesia Assessment:                           - Prior to the procedure, a History and Physical                            was performed, and patient medications and                            allergies were reviewed. The patient's tolerance of                            previous anesthesia was also reviewed. The risks                            and benefits of the procedure and the sedation                            options and risks were discussed with the patient.                            All questions were answered, and informed consent                            was obtained. Prior Anticoagulants: The patient has                            taken no previous anticoagulant or antiplatelet                            agents. ASA Grade Assessment: II - A patient with                            mild systemic disease. After reviewing the risks                            and benefits, the patient was deemed in                            satisfactory condition to undergo the procedure.  After obtaining informed consent, the colonoscope                            was passed under direct vision. Throughout the                            procedure, the patient's blood pressure, pulse, and                            oxygen saturations were monitored continuously. The                            Colonoscope was introduced through the anus and                            advanced to the the  cecum, identified by                            appendiceal orifice and ileocecal valve. The                            ileocecal valve, appendiceal orifice, and rectum                            were photographed. The quality of the bowel                            preparation was good. The colonoscopy was performed                            without difficulty. The patient tolerated the                            procedure well. Scope In: 10:54:43 AM Scope Out: 11:05:24 AM Scope Withdrawal Time: 0 hours 8 minutes 2 seconds  Total Procedure Duration: 0 hours 10 minutes 41 seconds  Findings:                 The perianal and digital rectal examinations were                            normal.                           Internal hemorrhoids were found during                            retroflexion. The hemorrhoids were small and Grade                            I (internal hemorrhoids that do not prolapse).                           The exam was otherwise without abnormality on  direct and retroflexion views. Complications:            No immediate complications. Estimated blood loss:                            None. Estimated Blood Loss:     Estimated blood loss: none. Impression:               - Internal hemorrhoids.                           - The examination was otherwise normal on direct                            and retroflexion views.                           - No specimens collected. Recommendation:           - Repeat colonoscopy in 5 years for surveillance.                           - Patient has a contact number available for                            emergencies. The signs and symptoms of potential                            delayed complications were discussed with the                            patient. Return to normal activities tomorrow.                            Written discharge instructions were provided to the                             patient.                           - Resume previous diet.                           - Continue present medications. Ladene Artist, MD 04/21/2017 11:07:51 AM This report has been signed electronically.

## 2017-04-21 NOTE — Progress Notes (Signed)
Pt's states no medical or surgical changes since previsit or office visit. 

## 2017-04-21 NOTE — Patient Instructions (Signed)
YOU HAD AN ENDOSCOPIC PROCEDURE TODAY AT THE [facility]:   Refer to the procedure report that was given to you for any specific questions about what was found during the examination.  If the procedure report does not answer your questions, please call your gastroenterologist to clarify.  If you requested that your care partner not be given the details of your procedure findings, then the procedure report has been included in a sealed envelope for you to review at your convenience later.  YOU SHOULD EXPECT: Some feelings of bloating in the abdomen. Passage of more gas than usual.  Walking can help get rid of the air that was put into your GI tract during the procedure and reduce the bloating. If you had a lower endoscopy (such as a colonoscopy or flexible sigmoidoscopy) you may notice spotting of blood in your stool or on the toilet paper. If you underwent a bowel prep for your procedure, you may not have a normal bowel movement for a few days.  Please Note:  You might notice some irritation and congestion in your nose or some drainage.  This is from the oxygen used during your procedure.  There is no need for concern and it should clear up in a day or so.  SYMPTOMS TO REPORT IMMEDIATELY:   Following lower endoscopy (colonoscopy or flexible sigmoidoscopy):  Excessive amounts of blood in the stool  Significant tenderness or worsening of abdominal pains  Swelling of the abdomen that is new, acute  Fever of 100F or higher  For urgent or emergent issues, a gastroenterologist can be reached at any hour by calling [phone].   DIET:  We do recommend a small meal at first, but then you may proceed to your regular diet.  Drink plenty of fluids but you should avoid alcoholic beverages for 24 hours.  MEDICATIONS: Continue present medications.  Please see handouts given to you by your recovery nurse.  ACTIVITY:  You should plan to take it easy for the rest of today and you should NOT  DRIVE or use heavy machinery until tomorrow (because of the sedation medicines used during the test).    FOLLOW UP: Our staff will call the number listed on your records the next business day following your procedure to check on you and address any questions or concerns that you may have regarding the information given to you following your procedure. If we do not reach you, we will leave a message.  However, if you are feeling well and you are not experiencing any problems, there is no need to return our call.  We will assume that you have returned to your regular daily activities without incident.  If any biopsies were taken you will be contacted by phone or by letter within the next 1-3 weeks.  Please call us at [phone] if you have not heard about the biopsies in 3 weeks.   Thank you for allowing us to provide for your healthcare needs today.   SIGNATURES/CONFIDENTIALITY: You and/or your care partner have signed paperwork which will be entered into your electronic medical record.  These signatures attest to the fact that that the information above on your After Visit Summary has been reviewed and is understood.  Full responsibility of the confidentiality of this discharge information lies with you and/or your care-partner. 

## 2017-04-25 ENCOUNTER — Telehealth: Payer: Self-pay | Admitting: *Deleted

## 2017-04-25 NOTE — Telephone Encounter (Signed)
  Follow up Call-  Call back number 04/21/2017  Post procedure Call Back phone  # [phone]  Permission to leave phone message Yes  Some recent data might be hidden     Patient questions:  Do you have a fever, pain , or abdominal swelling? No. Pain Score  0 *  Have you tolerated food without any problems? Yes.    Have you been able to return to your normal activities? Yes.    Do you have any questions about your discharge instructions: Diet   No. Medications  No. Follow up visit  No.  Do you have questions or concerns about your Care? No.  Actions: * If pain score is 4 or above: No action needed, pain <4.

## 2019-09-05 ENCOUNTER — Other Ambulatory Visit: Payer: Self-pay

## 2019-09-05 DIAGNOSIS — Z20822 Contact with and (suspected) exposure to covid-19: Secondary | ICD-10-CM

## 2019-09-07 LAB — NOVEL CORONAVIRUS, NAA: SARS-CoV-2, NAA: NOT DETECTED

## 2019-12-16 ENCOUNTER — Other Ambulatory Visit: Payer: Self-pay

## 2019-12-16 ENCOUNTER — Ambulatory Visit (INDEPENDENT_AMBULATORY_CARE_PROVIDER_SITE_OTHER): Payer: No Typology Code available for payment source | Admitting: Family Medicine

## 2019-12-16 ENCOUNTER — Encounter: Payer: Self-pay | Admitting: Family Medicine

## 2019-12-16 VITALS — BP 188/104 | HR 66 | Temp 98.4°F | Resp 12 | Ht 68.0 in | Wt 208.8 lb

## 2019-12-16 DIAGNOSIS — Z8249 Family history of ischemic heart disease and other diseases of the circulatory system: Secondary | ICD-10-CM

## 2019-12-16 DIAGNOSIS — Z8349 Family history of other endocrine, nutritional and metabolic diseases: Secondary | ICD-10-CM

## 2019-12-16 DIAGNOSIS — I160 Hypertensive urgency: Secondary | ICD-10-CM | POA: Diagnosis not present

## 2019-12-16 DIAGNOSIS — Z833 Family history of diabetes mellitus: Secondary | ICD-10-CM

## 2019-12-16 DIAGNOSIS — Z7689 Persons encountering health services in other specified circumstances: Secondary | ICD-10-CM | POA: Diagnosis not present

## 2019-12-16 DIAGNOSIS — Z823 Family history of stroke: Secondary | ICD-10-CM | POA: Diagnosis not present

## 2019-12-16 DIAGNOSIS — Z9119 Patient's noncompliance with other medical treatment and regimen: Secondary | ICD-10-CM

## 2019-12-16 DIAGNOSIS — D72819 Decreased white blood cell count, unspecified: Secondary | ICD-10-CM | POA: Diagnosis not present

## 2019-12-16 DIAGNOSIS — Z91199 Patient's noncompliance with other medical treatment and regimen due to unspecified reason: Secondary | ICD-10-CM

## 2019-12-16 NOTE — Progress Notes (Signed)
New patient office visit note:  Impression and Recommendations:    1. Encounter to establish care with new doctor   2. Asymptomatic hypertensive urgency vs white coat syndrome   3. 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 Family history of cerebrovascular accident (CVA) in father   43. Family history of hypertension   6. Family history of diabetes mellitus in first degree relative   7. Family history of vitamin D deficiency     Encounter to Establish Care with New Doctor - Extensive discussion held with patient regarding establishing as a new patient.  Discussed policies and practices here at the clinic, and answered all questions about care team and health management during appointment.  - Discussed need for patient to continue to obtain management and screenings with all established specialists.  Educated patient at length about the critical importance of keeping health maintenance up to date.  - Participated in lengthy conversation and all questions were answered.   History of Noncompliance - Need for Lab Work - Per patient, fasting today. -Patient states he had [name] which [facility] low our practice in the past however, there is no medical records in the past 10+ years that he has seen them. -Patient confirms he did not see any other primary care physicians. - Lab work obtained.   Chronic Leukopenia Per Dr. Heath Lark of Hematology Oncology 11/16/2015: As I mentioned to the patient before, 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  His blood work fluctuated up and now over the past few years and he is completely asymptomatic.  He does not need long-term follow-up.  He had underwent extensive evaluation in 2014 including bone marrow biopsy and that was negative for myelodysplastic syndrome.  I will see him as needed only in the future.   Asymptomatic hypertensive urgency VS white  coat syndrome - BP elevated in office today.   - Per patient, his blood pressure runs in the 130s over 80s whenever checked at home but it has been several months since his last check.   - has always been up a little when going to doctors visits and per patient Dr. Chapman Fitch told him is a little bit high several years ago.  However, in the past, whenever he checked it at home, was within normal limits per patient  - BP on re-check in office remains elevated at 188/104.   Patient remains completely asymptomatic otherwise  - Since his BP is high today, and this is the first time I am meeting him, I recommended that the patient call the office in about a week or less if BP remains elevated to this level.  Patient knows to monitor his BP today and over the next 7-10 days.  If BP remains elevated, and labs have come back and are stable, patient will call and BP medication will be provided.  - If at any time he experiences symptoms such as dizziness, headache, visual changes, chest tightness, numbness etc. alongside high BP, patient knows to go to the emergency room.   - Reviewed goal BP with patient today.  To be around 135/85 or less on a regular basis.  - Discussed that elevated BP could indicate white coat syndrome, and critical need to check BP at home.  Encouraged patient to engage in ambulatory BP monitoring daily, using an upper arm BP cuff.  - Counseled patient on pathophysiology of hypertension and  discussed various treatment options, which always includes dietary and lifestyle modification as first line.   - Lifestyle changes such as dash and heart healthy diets and engaging in a regular exercise program discussed extensively with patient.   - Ambulatory blood pressure monitoring encouraged daily.  Keep log of BP and pulse and bring in every office visit.  Reminded patient that if they ever feel poorly in any way, to check their blood pressure and pulse.  - Prior to checking BP, encouraged  patient to avoid physical stimulation, avoid stimulants such as caffeine, and sit quietly for 15-20 minutes.  - Handouts provided at patient's desire and/or told to go online at the [place] website for further information  - We will continue to monitor closely.   BMI Counseling - Body mass index is 31.75 kg/m Explained to patient what BMI refers to, and what it means medically.    Told patient to think about it as a "medical risk stratification measurement" and how increasing BMI is associated with increasing risk/ or worsening state of various diseases such as hypertension, hyperlipidemia, diabetes, premature OA, depression etc.  American Heart Association guidelines for healthy diet, basically Mediterranean diet, and exercise guidelines of 30 minutes 5 days per week or more discussed in detail.  Health counseling performed.  All questions answered.  Health Counseling & Preventative Maintenance - Advised patient to continue working toward exercising to improve overall mental, physical, and emotional health.    - Reviewed the "spokes of the wheel" of mood and health management.  Stressed the importance of ongoing prudent habits, including regular exercise, appropriate sleep hygiene, healthful dietary habits, and prayer/meditation to relax.  - Encouraged patient to engage in daily physical activity, especially a formal exercise routine.  Recommended that the patient eventually strive for at least 150 minutes of moderate cardiovascular activity per week according to guidelines established by the [facility].   - Healthy dietary habits encouraged, including low-carb, and high amounts of lean protein in diet.   - Patient should also consume adequate amounts of water.   Education and routine counseling performed. Handouts provided.  Recommendations - Return OV in 3 weeks to review BP log and blood work. - Patient will return sooner if needed.   Orders Placed This Encounter    Procedures  . CBC w/Diff  . Comprehensive metabolic panel  . TSH  . T4, free  . Hemoglobin A1c  . Lipid panel  . VITAMIN D 25 Hydroxy (Vit-D Deficiency, Fractures)    Medications Discontinued During This Encounter  Medication Reason  . Na Sulfate-K Sulfate-Mg Sulf (SUPREP BOWEL PREP KIT) 17.5-3.13-1.6 GM/180ML SOLN Error     Gross side effects, risk and benefits, and alternatives of medications discussed with patient.  Patient is aware that all medications have potential side effects and we are unable to predict every side effect or drug-drug interaction that may occur.  Expresses verbal understanding and consents to current therapy plan and treatment regimen.   Please see AVS handed out to patient at the end of our visit for further patient instructions/ counseling done pertaining to today's office visit.    Note:  This document was prepared using Dragon voice recognition software and may include unintentional dictation errors.  This document serves as a record of services personally performed by Mellody Dance, DO. It was created on her behalf by Toni Amend, a trained medical scribe. The creation of this record is based on the scribe's personal observations and the provider's statements to them.  This case required medical decision making of at least moderate complexity. The above documentation has been reviewed to be accurate and was completed by Marjory Sneddon, D.O.      ---------------------------------------------------------------------------------------------------------------------------------------------------------------------------------------------    Subjective:    Phillips Odor, am serving as scribe for Dr. Mellody Dance.   Chief complaint:   Chief Complaint  Patient presents with  . New Patient (Initial Visit)     HPI: Jay Arnold is a pleasant 59 y.o. male who presents to [place] [facility] today to  review their medical history with me and establish care.   I asked the patient to review their chronic problem list with me to ensure everything was updated and accurate.    All recent office visits with other providers, any medical records that patient brought in etc  - I reviewed today.     We asked pt to get [name] their medical records from [facility] providers/ specialists that they had seen within the past 3-5 years- if they are in private practice and/or do not work for [employer], [facility], [facility], [facility] or [employer] owned practice.  Told them to call their specialists to clarify this if they are not sure.    Patient states his former PCP was Dr. Chapman Fitch with [facility]; notes quit seeing them about 5-6 years ago.  Last saw oncology in 2016.  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  Notes "I manage a group of engineers; they are system service representatives, all out."  Notes his area is [place], [place], [place].  He doesn't do a lot of travelling; he empowers the engineers he manages to do their job/travelling and maintenance.  Currently responsible for 27 employees, down from 26.  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  Has been doing this for 34 years.  Confirms his job is stressful; "you've always got to be available, never know what's going to come your way."  Two kids, aged 85 and 25. Married to wife Malachy Mood for 30 years.  Monogamous with wife.  Tobacco, Alcohol, Caffeine Use Never smoker; drinks alcohol occasionally. Has five cups of black coffee daily, no cream or sugar.  Exercise Habits Exercises 90 minutes per day, 45 minutes cardio. Mainly walks/runs in the neighborhood. Does have exercise equipment at home.  Was an athlete when he was younger. 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  Family History  Has five siblings; six of them total, including the  patient.  One sister (4 years younger) has diabetes, onset in late 8's. Confirms that she was heavier; she's lost weight since her diagnosis.  Parents have had type II diabetes off and on, "but I think that was mainly diet and weight."  Dad had a stroke at age 73, was diagnosed with lupus.  Dad also had HTN.  Denies early family of CAD that he knows of.  Mom was "moderately healthy."  Notes "she's always been a heavy person." Mom passed back in October.  Denies cancer in siblings or parents.  Surgical History Has had a tooth extraction, appendectomy, and vasectomy.  Past Medical History  Denies personal history of hypertension diagnosis or diabetes.  - Eye Health Has an upcoming eye appointment scheduled.  - Elevated Blood Pressure Notes that he checks his BP periodically using a wrist monitor.  "The top number is normally 130-something, lower number  80's."  States it's probably been a year since he's checked his BP.  Denies dizziness, headaches.  - Chronic Leukopenia Pt with unknown issue causing decreased neutrophil count and elevated lymphocytes; has undergone extensive workup for this in the past, including bone marrow biopsy.  Per Dr. Heath Lark of Hematology Oncology 11/16/2015: As I mentioned to the patient before, 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  His blood work fluctuated up and now over the past few years and he is completely asymptomatic.  He does not need long-term follow-up.  He had underwent extensive evaluation in 2014 including bone marrow biopsy and that was negative for myelodysplastic syndrome.  I will see him as needed only in the future.   Wt Readings from Last 3 Encounters:  12/16/19 208 lb 12.8 oz (94.7 kg)  04/21/17 206 lb (93.4 kg)  04/07/17 206 lb 6.4 oz (93.6 kg)   BP Readings from Last 3 Encounters:  12/16/19 (!) 188/104  04/21/17 (!) 143/89  11/16/15 (!) 147/92   Pulse Readings from  Last 3 Encounters:  12/16/19 66  04/21/17 (!) 55  11/16/15 65   BMI Readings from Last 3 Encounters:  12/16/19 31.75 kg/m  04/21/17 30.42 kg/m  04/07/17 30.48 kg/m    Patient Care Team    Relationship Specialty Notifications Start End  Mellody Dance, DO PCP - General Family Medicine  12/16/19   Heath Lark, MD Consulting Physician Hematology and Oncology  10/21/13   Ladene Artist, MD Consulting Physician Gastroenterology  12/16/19   Nobie Putnam, MD Consulting Physician Oncology  12/16/19     Patient Active Problem List   Diagnosis Date Noted  . Asymptomatic hypertensive urgency vs white coat 12/16/2019  . Chronic leukopenia 11/12/2015  . Family history of cerebrovascular accident (CVA) in father 12/16/2019  . Family history of hypertension 12/16/2019  . Family history of diabetes mellitus in first degree relative 12/16/2019  . Preventive measure 11/16/2015  . Leukocytopenia        As reported by pt:  Past Medical History:  Diagnosis Date  . Leukocytopenia   . Leukopenia 11/12/2015     Past Surgical History:  Procedure Laterality Date  . APPENDECTOMY  2010  . Left neck cyst removal  2010  . VASECTOMY  2007     Family History  Problem Relation Age of Onset  . Breast cancer Paternal Grandmother   . Prostate cancer Maternal Uncle        x 2 uncles  . Heart disease Paternal Grandfather   . Heart disease Maternal Grandmother   . Diabetes Father   . Diabetes Sister   . Colon cancer Neg Hx   . Stomach cancer Neg Hx      Social History   Substance and Sexual Activity  Drug Use No     Social History   Substance and Sexual Activity  Alcohol Use Yes   Comment: rare     Social History   Tobacco Use  Smoking Status Never Smoker  Smokeless Tobacco Never Used     No outpatient medications have been marked as taking for the 12/16/19 encounter (Office Visit) with Mellody Dance, DO.   Current Facility-Administered Medications for the 12/16/19  encounter (Office Visit) with Mellody Dance, DO  Medication  . 0.9 %  sodium chloride infusion  . 0.9 %  sodium chloride infusion    Allergies: Patient has no known allergies.   Review of Systems  Constitutional: Negative for  chills, diaphoresis, fever, malaise/fatigue and weight loss.  HENT: Negative for congestion, sore throat and tinnitus.   Eyes: Negative for blurred vision, double vision and photophobia.  Respiratory: Negative for cough and wheezing.   Cardiovascular: Negative for chest pain and palpitations.  Gastrointestinal: Negative for blood in stool, diarrhea, nausea and vomiting.  Genitourinary: Negative for dysuria, frequency and urgency.  Musculoskeletal: Negative for joint pain and myalgias.  Skin: Negative for itching and rash.  Neurological: Negative for dizziness, focal weakness, weakness and headaches.  Endo/Heme/Allergies: Negative for environmental allergies and polydipsia. Does not bruise/bleed easily.  Psychiatric/Behavioral: Negative for depression and memory loss. The patient is not nervous/anxious and does not have insomnia.         Objective:   Blood pressure (!) 188/104, pulse 66, temperature 98.4 F (36.9 C), temperature source Oral, resp. rate 12, height '5\' 8"'  (1.727 m), weight 208 lb 12.8 oz (94.7 kg), SpO2 100 %. Body mass index is 31.75 kg/m. General: Well Developed, well nourished, and in no acute distress.  Neuro: Alert and oriented x3, extra-ocular muscles intact, sensation grossly intact.  HEENT:[place]/AT, PERRLA, neck supple, No carotid bruits Skin: no gross rashes  Cardiac: Regular rate and rhythm Respiratory: Essentially clear to auscultation bilaterally. Not using accessory muscles, speaking in full sentences.  Abdominal: not grossly distended Musculoskeletal: Ambulates w/o diff, FROM * 4 ext.  Vasc: less 2 sec cap RF, warm and pink  Psych:  No HI/SI, judgement and insight good, Euthymic mood. Full Affect.    No results found for  this or any previous visit (from the past 2160 hour(s)).

## 2019-12-16 NOTE — Patient Instructions (Addendum)
Your goal blood pressure should be 135/85 or less on a regular basis, her medications should be started.  Normal blood pressure is 120/80 or less.  Jay Arnold, since your blood pressure remains elevated today in the very high range, in order to know that this is whitecoat syndrome versus true hypertension, we will need to check your blood pressure and pulses every day.  If over the next 7 to 10 days, if the blood pressure remains elevated around where it is now, please call the office as we will have your labs back by then and we can start a blood pressure medicine and then you still keep your follow-up in 3 weeks or so.    Hypertension Hypertension, commonly called high blood pressure, is when the force of blood pumping through the arteries is too strong. The arteries are the blood vessels that carry blood from the heart throughout the body. Hypertension forces the heart to work harder to pump blood and may cause arteries to become narrow or stiff. Having untreated or uncontrolled hypertension can cause heart attacks, strokes, kidney disease, and other problems. A blood pressure reading consists of a higher number over a lower number. Ideally, your blood pressure should be below 120/80. The first ("top") number is called the systolic pressure. It is a measure of the pressure in your arteries as your heart beats. The second ("bottom") number is called the diastolic pressure. It is a measure of the pressure in your arteries as the heart relaxes. What are the causes? The cause of this condition is not known. What increases the risk? Some risk factors for high blood pressure are under your control. Others are not. Factors you can change  Smoking.  Having type 2 diabetes mellitus, high cholesterol, or both.  Not getting enough exercise or physical activity.  Being overweight.  Having too much fat, sugar, calories, or salt (sodium) in your diet.  Drinking too much alcohol. Factors that are  difficult or impossible to change  Having chronic kidney disease.  Having a family history of high blood pressure.  Age. Risk increases with age.  Race. You may be at higher risk if you are African-American.  Gender. Men are at higher risk than women before age 31. After age 75, women are at higher risk than men.  Having obstructive sleep apnea.  Stress. What are the signs or symptoms? Extremely high blood pressure (hypertensive crisis) may cause:  Headache.  Anxiety.  Shortness of breath.  Nosebleed.  Nausea and vomiting.  Severe chest pain.  Jerky movements you cannot control (seizures).  How is this diagnosed? This condition is diagnosed by measuring your blood pressure while you are seated, with your arm resting on a surface. The cuff of the blood pressure monitor will be placed directly against the skin of your upper arm at the level of your heart. It should be measured at least twice using the same arm. Certain conditions can cause a difference in blood pressure between your right and left arms. Certain factors can cause blood pressure readings to be lower or higher than normal (elevated) for a short period of time:  When your blood pressure is higher when you are in a health care provider's office than when you are at home, this is called white coat hypertension. Most people with this condition do not need medicines.  When your blood pressure is higher at home than when you are in a health care provider's office, this is called masked hypertension. Most people  with this condition may need medicines to control blood pressure.  If you have a high blood pressure reading during one visit or you have normal blood pressure with other risk factors:  You may be asked to return on a different day to have your blood pressure checked again.  You may be asked to monitor your blood pressure at home for 1 week or longer.  If you are diagnosed with hypertension, you may have  other blood or imaging tests to help your health care provider understand your overall risk for other conditions. How is this treated? This condition is treated by making healthy lifestyle changes, such as eating healthy foods, exercising more, and reducing your alcohol intake. Your health care provider may prescribe medicine if lifestyle changes are not enough to get your blood pressure under control, and if:  Your systolic blood pressure is above 130.  Your diastolic blood pressure is above 80.  Your personal target blood pressure may vary depending on your medical conditions, your age, and other factors. Follow these instructions at home: Eating and drinking  Eat a diet that is high in fiber and potassium, and low in sodium, added sugar, and fat. An example eating plan is called the DASH (Dietary Approaches to Stop Hypertension) diet. To eat this way: ? Eat plenty of fresh fruits and vegetables. Try to fill half of your plate at each meal with fruits and vegetables. ? Eat whole grains, such as whole wheat pasta, brown rice, or whole grain bread. Fill about one quarter of your plate with whole grains. ? Eat or drink low-fat dairy products, such as skim milk or low-fat yogurt. ? Avoid fatty cuts of meat, processed or cured meats, and poultry with skin. Fill about one quarter of your plate with lean proteins, such as fish, chicken without skin, beans, eggs, and tofu. ? Avoid premade and processed foods. These tend to be higher in sodium, added sugar, and fat.  Reduce your daily sodium intake. Most people with hypertension should eat less than 1,500 mg of sodium a day.  Limit alcohol intake to no more than 1 drink a day for nonpregnant women and 2 drinks a day for men. One drink equals 12 oz of beer, 5 oz of wine, or 1 oz of hard liquor. Lifestyle  Work with your health care provider to maintain a healthy body weight or to lose weight. Ask what an ideal weight is for you.  Get at least 30  minutes of exercise that causes your heart to beat faster (aerobic exercise) most days of the week. Activities may include walking, swimming, or biking.  Include exercise to strengthen your muscles (resistance exercise), such as pilates or lifting weights, as part of your weekly exercise routine. Try to do these types of exercises for 30 minutes at least 3 days a week.  Do not use any products that contain nicotine or tobacco, such as cigarettes and e-cigarettes. If you need help quitting, ask your health care provider.  Monitor your blood pressure at home as told by your health care provider.  Keep all follow-up visits as told by your health care provider. This is important. Medicines  Take over-the-counter and prescription medicines only as told by your health care provider. Follow directions carefully. Blood pressure medicines must be taken as prescribed.  Do not skip doses of blood pressure medicine. Doing this puts you at risk for problems and can make the medicine less effective.  Ask your health care provider about side  effects or reactions to medicines that you should watch for. Contact a health care provider if:  You think you are having a reaction to a medicine you are taking.  You have headaches that keep coming back (recurring).  You feel dizzy.  You have swelling in your ankles.  You have trouble with your vision. Get help right away if:  You develop a severe headache or confusion.  You have unusual weakness or numbness.  You feel faint.  You have severe pain in your chest or abdomen.  You vomit repeatedly.  You have trouble breathing. Summary  Hypertension is when the force of blood pumping through your arteries is too strong. If this condition is not controlled, it may put you at risk for serious complications.  Your personal target blood pressure may vary depending on your medical conditions, your age, and other factors. For most people, a normal blood  pressure is less than 120/80.  Hypertension is treated with lifestyle changes, medicines, or a combination of both. Lifestyle changes include weight loss, eating a healthy, low-sodium diet, exercising more, and limiting alcohol. This information is not intended to replace advice given to you by your health care provider. Make sure you discuss any questions you have with your health care provider. Document Released: 11/14/2005 Document Revised: 10/12/2016 Document Reviewed: 10/12/2016 Elsevier Interactive Patient Education  2018 [employer].    How to Take Your Blood Pressure   Blood pressure is a measurement of how strongly your blood is pressing against the walls of your arteries. Arteries are blood vessels that carry blood from your heart throughout your body. Your health care provider takes your blood pressure at each office visit. You can also take your own blood pressure at home with a blood pressure machine. You may need to take your own blood pressure:  To confirm a diagnosis of high blood pressure (hypertension).  To monitor your blood pressure over time.  To make sure your blood pressure medicine is working.  Supplies needed: To take your blood pressure, you will need a blood pressure machine. You can buy a blood pressure machine, or blood pressure monitor, at most drugstores or online. There are several types of home blood pressure monitors. When choosing one, consider the following:  Choose a monitor that has an arm cuff.  Choose a monitor that wraps snugly around your upper arm. You should be able to fit only one finger between your arm and the cuff.  Do not choose a monitor that measures your blood pressure from your wrist or finger.  Your health care provider can suggest a reliable monitor that will meet your needs. How to prepare To get the most accurate reading, avoid the following for 30 minutes before you check your blood pressure:  Drinking caffeine.  Drinking  alcohol.  Eating.  Smoking.  Exercising.  Five minutes before you check your blood pressure:  Empty your bladder.  Sit quietly without talking in a dining chair, rather than in a soft couch or armchair.  How to take your blood pressure To check your blood pressure, follow the instructions in the manual that came with your blood pressure monitor. If you have a digital blood pressure monitor, the instructions may be as follows: 1. Sit up straight. 2. Place your feet on the floor. Do not cross your ankles or legs. 3. Rest your left arm at the level of your heart on a table or desk or on the arm of a chair. 4. Pull up  your shirt sleeve. 5. Wrap the blood pressure cuff around the upper part of your left arm, 1 inch (2.5 cm) above your elbow. It is best to wrap the cuff around bare skin. 6. Fit the cuff snugly around your arm. You should be able to place only one finger between the cuff and your arm. 7. Position the cord inside the groove of your elbow. 8. Press the power button. 9. Sit quietly while the cuff inflates and deflates. 10. Read the digital reading on the monitor screen and write it down (record it). 11. Wait 2-3 minutes, then repeat the steps, starting at step 1.  What does my blood pressure reading mean? A blood pressure reading consists of a higher number over a lower number. Ideally, your blood pressure should be below 120/80. The first ("top") number is called the systolic pressure. It is a measure of the pressure in your arteries as your heart beats. The second ("bottom") number is called the diastolic pressure. It is a measure of the pressure in your arteries as the heart relaxes. Blood pressure is classified into four stages. The following are the stages for adults who do not have a short-term serious illness or a chronic condition. Systolic pressure and diastolic pressure are measured in a unit called mm Hg. Normal  Systolic pressure: below 123456.  Diastolic pressure:  below 80. Elevated  Systolic pressure: [id number].  Diastolic pressure: below 80. Hypertension stage 1  Systolic pressure: 0000000.  Diastolic pressure: [id number]. Hypertension stage 2  Systolic pressure: [id number] or above.  Diastolic pressure: 90 or above. You can have prehypertension or hypertension even if only the systolic or only the diastolic number in your reading is higher than normal. Follow these instructions at home:  Check your blood pressure as often as recommended by your health care provider.  Take your monitor to the next appointment with your health care provider to make sure: ? That you are using it correctly. ? That it provides accurate readings.  Be sure you understand what your goal blood pressure numbers are.  Tell your health care provider if you are having any side effects from blood pressure medicine. Contact a health care provider if:  Your blood pressure is consistently high. Get help right away if:  Your systolic blood pressure is higher than 180.  Your diastolic blood pressure is higher than 110. This information is not intended to replace advice given to you by your health care provider. Make sure you discuss any questions you have with your health care provider. Document Released: 04/22/2016 Document Revised: 07/05/2016 Document Reviewed: 04/22/2016 Elsevier Interactive Patient Education  [employer].

## 2019-12-17 LAB — COMPREHENSIVE METABOLIC PANEL
ALT: 21 IU/L (ref 0–44)
AST: 24 IU/L (ref 0–40)
Albumin/Globulin Ratio: 1.7 (ref 1.2–2.2)
Albumin: 4.5 g/dL (ref 3.8–4.9)
Alkaline Phosphatase: 54 IU/L (ref 39–117)
BUN/Creatinine Ratio: 12 (ref 9–20)
BUN: 14 mg/dL (ref 6–24)
Bilirubin Total: 0.6 mg/dL (ref 0.0–1.2)
CO2: 28 mmol/L (ref 20–29)
Calcium: 9.1 mg/dL (ref 8.7–10.2)
Chloride: 104 mmol/L (ref 96–106)
Creatinine, Ser: 1.14 mg/dL (ref 0.76–1.27)
GFR calc Af Amer: 83 mL/min/{1.73_m2} (ref >59)
GFR calc non Af Amer: 71 mL/min/{1.73_m2} (ref >59)
Globulin, Total: 2.6 g/dL (ref 1.5–4.5)
Glucose: 86 mg/dL (ref 65–99)
Potassium: 4.7 mmol/L (ref 3.5–5.2)
Sodium: 142 mmol/L (ref 134–144)
Total Protein: 7.1 g/dL (ref 6.0–8.5)

## 2019-12-17 LAB — CBC WITH DIFFERENTIAL/PLATELET
Basophils Absolute: 0 10*3/uL (ref 0.0–0.2)
Basos: 1 %
EOS (ABSOLUTE): 0.1 10*3/uL (ref 0.0–0.4)
Eos: 1 %
Hematocrit: 40.1 % (ref 37.5–51.0)
Hemoglobin: 14.3 g/dL (ref 13.0–17.7)
Immature Grans (Abs): 0 10*3/uL (ref 0.0–0.1)
Immature Granulocytes: 0 %
Lymphocytes Absolute: 1.5 10*3/uL (ref 0.7–3.1)
Lymphs: 42 %
MCH: 30.6 pg (ref 26.6–33.0)
MCHC: 35.7 g/dL (ref 31.5–35.7)
MCV: 86 fL (ref 79–97)
Monocytes Absolute: 0.5 10*3/uL (ref 0.1–0.9)
Monocytes: 13 %
Neutrophils Absolute: 1.6 10*3/uL (ref 1.4–7.0)
Neutrophils: 43 %
Platelets: 182 10*3/uL (ref 150–450)
RBC: 4.67 x10E6/uL (ref 4.14–5.80)
RDW: 13.3 % (ref 11.6–15.4)
WBC: 3.6 10*3/uL (ref 3.4–10.8)

## 2019-12-17 LAB — HEMOGLOBIN A1C
Est. average glucose Bld gHb Est-mCnc: 123 mg/dL
Hgb A1c MFr Bld: 5.9 % — ABNORMAL HIGH (ref 4.8–5.6)

## 2019-12-17 LAB — LIPID PANEL
Chol/HDL Ratio: 3.7 ratio (ref 0.0–5.0)
Cholesterol, Total: 199 mg/dL (ref 100–199)
HDL: 54 mg/dL (ref >39)
LDL Chol Calc (NIH): 131 mg/dL — ABNORMAL HIGH (ref 0–99)
Triglycerides: 75 mg/dL (ref 0–149)
VLDL Cholesterol Cal: 14 mg/dL (ref 5–40)

## 2019-12-17 LAB — T4, FREE: Free T4: 1.23 ng/dL (ref 0.82–1.77)

## 2019-12-17 LAB — VITAMIN D 25 HYDROXY (VIT D DEFICIENCY, FRACTURES): Vit D, 25-Hydroxy: 16.1 ng/mL — ABNORMAL LOW (ref 30.0–100.0)

## 2019-12-17 LAB — TSH: TSH: 1.45 u[IU]/mL (ref 0.450–4.500)

## 2019-12-23 ENCOUNTER — Encounter: Payer: Self-pay | Admitting: Family Medicine

## 2019-12-23 ENCOUNTER — Ambulatory Visit: Payer: No Typology Code available for payment source | Admitting: Adult Health

## 2019-12-23 ENCOUNTER — Ambulatory Visit (INDEPENDENT_AMBULATORY_CARE_PROVIDER_SITE_OTHER): Payer: No Typology Code available for payment source | Admitting: Family Medicine

## 2019-12-23 ENCOUNTER — Other Ambulatory Visit: Payer: Self-pay

## 2019-12-23 VITALS — BP 150/93 | Ht 69.0 in | Wt 203.0 lb

## 2019-12-23 DIAGNOSIS — I10 Essential (primary) hypertension: Secondary | ICD-10-CM | POA: Insufficient documentation

## 2019-12-23 DIAGNOSIS — D72819 Decreased white blood cell count, unspecified: Secondary | ICD-10-CM | POA: Diagnosis not present

## 2019-12-23 DIAGNOSIS — E785 Hyperlipidemia, unspecified: Secondary | ICD-10-CM | POA: Diagnosis not present

## 2019-12-23 DIAGNOSIS — R7303 Prediabetes: Secondary | ICD-10-CM | POA: Insufficient documentation

## 2019-12-23 DIAGNOSIS — E559 Vitamin D deficiency, unspecified: Secondary | ICD-10-CM

## 2019-12-23 MED ORDER — DIALYVITE VITAMIN D 5000 125 MCG (5000 UT) PO CAPS: ORAL_CAPSULE | ORAL | Status: DC

## 2019-12-23 MED ORDER — OLMESARTAN-AMLODIPINE-HCTZ 20-5-12.5 MG PO TABS: ORAL_TABLET | ORAL | 0 refills | Status: DC

## 2019-12-23 NOTE — Progress Notes (Signed)
Virtual office visit note for [employer], D.O- Primary Care Physician at [facility]   I connected with current patient today and beyond visually recognizing the correct individual, I verified that I am speaking with the correct person using two identifiers.  . Location of the patient: Home . Location of the provider: Office Only the patient (+/- their family members at pt's discretion) and myself were participating in the encounter   - This visit type was conducted due to national recommendations for restrictions regarding the COVID-19 Pandemic (e.g. social distancing) in an effort to limit this patient's exposure and mitigate transmission in our community.  This format is felt to be most appropriate for this patient at this time.   - No physical exam could be performed with this format, beyond that communicated to [name] by the patient/ family members as noted.   - Additionally my office staff/ schedulers discussed with the patient that there may be a monetary charge related to this service, depending on patient's medical insurance.   The patient expressed understanding, and agreed to proceed.      History of Present Illness: No chief complaint on file.   Jay Arnold, am serving as scribe for Dr. Mellody Dance.  Notes he was able to pull his labs up on MyChart and compare them to another year.  HPI:  Hypertension:  -  His blood pressure at home has been running: States "it's been up and down, but not over 150."  "It's been down to maybe 140/mid 80's, but that's the lowest I've seen."  Thinks his BP has been up to 153, 154, over "normally around 90."  - He denies new onset of: chest pain, exercise intolerance, shortness of breath, dizziness, visual changes, headache, lower extremity swelling or claudication.   Last 3 blood pressure readings in our office are as follows: BP Readings from Last 3 Encounters:  12/23/19 (!) 150/93  12/16/19 (!) 188/104  04/21/17 (!)  143/89   Filed Weights   12/23/19 1325  Weight: 203 lb (92.1 kg)   HPI:  Hyperlipidemia:  Most recent cholesterol panel was:  Lab Results  Component Value Date   CHOL 199 12/16/2019   HDL 54 12/16/2019   LDLCALC 131 (H) 12/16/2019   TRIG 75 12/16/2019   CHOLHDL 3.7 12/16/2019   Hepatic Function Latest Ref Rng & Units 12/16/2019 10/21/2013 05/27/2013  Total Protein 6.0 - 8.5 g/dL 7.1 7.7 7.3  Albumin 3.8 - 4.9 g/dL 4.5 3.9 3.6  AST 0 - 40 IU/L 24 30 81(H)  ALT 0 - 44 IU/L 21 29 130(H)  Alk Phosphatase 39 - 117 IU/L 54 49 47  Total Bilirubin 0.0 - 1.2 mg/dL 0.6 0.55 0.56    Last A1C in the office was:  Lab Results  Component Value Date   HGBA1C 5.9 (H) 12/16/2019   Lab Results  Component Value Date   LDLCALC 131 (H) 12/16/2019   CREATININE 1.14 12/16/2019   BP Readings from Last 3 Encounters:  12/23/19 (!) 150/93  12/16/19 (!) 188/104  04/21/17 (!) 143/89   Wt Readings from Last 3 Encounters:  12/23/19 203 lb (92.1 kg)  12/16/19 208 lb 12.8 oz (94.7 kg)  04/21/17 206 lb (93.4 kg)      Depression screen [facility] 2/9 12/23/2019 12/16/2019  Decreased Interest 0 0  Down, Depressed, Hopeless 0 0  PHQ - 2 Score 0 0  Altered sleeping 0 0  Tired, decreased energy 0 1  Change  in appetite 0 0  Feeling bad or failure about yourself  0 0  Trouble concentrating 0 0  Moving slowly or fidgety/restless 0 0  Suicidal thoughts 0 0  PHQ-9 Score 0 1  Difficult doing work/chores - Not difficult at all    No flowsheet data found.  Recent Results (from the past 2160 hour(s))  CBC w/Diff     Status: None   Collection Time: 12/16/19 11:58 AM  Result Value Ref Range   WBC 3.6 3.4 - 10.8 x10E3/uL   RBC 4.67 4.14 - 5.80 x10E6/uL   Hemoglobin 14.3 13.0 - 17.7 g/dL   Hematocrit 40.1 37.5 - 51.0 %   MCV 86 79 - 97 fL   MCH 30.6 26.6 - 33.0 pg   MCHC 35.7 31.5 - 35.7 g/dL   RDW 13.3 11.6 - 15.4 %   Platelets 182 150 - 450 x10E3/uL   Neutrophils 43 Not Estab. %   Lymphs 42 Not  Estab. %   Monocytes 13 Not Estab. %   Eos 1 Not Estab. %   Basos 1 Not Estab. %   Neutrophils Absolute 1.6 1.4 - 7.0 x10E3/uL   Lymphocytes Absolute 1.5 0.7 - 3.1 x10E3/uL   Monocytes Absolute 0.5 0.1 - 0.9 x10E3/uL   EOS (ABSOLUTE) 0.1 0.0 - 0.4 x10E3/uL   Basophils Absolute 0.0 0.0 - 0.2 x10E3/uL   Immature Granulocytes 0 Not Estab. %   Immature Grans (Abs) 0.0 0.0 - 0.1 x10E3/uL  Comprehensive metabolic panel     Status: None   Collection Time: 12/16/19 11:58 AM  Result Value Ref Range   Glucose 86 65 - 99 mg/dL   BUN 14 6 - 24 mg/dL   Creatinine, Ser 1.14 0.76 - 1.27 mg/dL   GFR calc non Af Amer 71 >59 mL/min/1.73   GFR calc Af Amer 83 >59 mL/min/1.73   BUN/Creatinine Ratio 12 9 - 20   Sodium 142 134 - 144 mmol/L   Potassium 4.7 3.5 - 5.2 mmol/L   Chloride 104 96 - 106 mmol/L   CO2 28 20 - 29 mmol/L   Calcium 9.1 8.7 - 10.2 mg/dL   Total Protein 7.1 6.0 - 8.5 g/dL   Albumin 4.5 3.8 - 4.9 g/dL   Globulin, Total 2.6 1.5 - 4.5 g/dL   Albumin/Globulin Ratio 1.7 1.2 - 2.2   Bilirubin Total 0.6 0.0 - 1.2 mg/dL   Alkaline Phosphatase 54 39 - 117 IU/L   AST 24 0 - 40 IU/L   ALT 21 0 - 44 IU/L  TSH     Status: None   Collection Time: 12/16/19 11:58 AM  Result Value Ref Range   TSH 1.450 0.450 - 4.500 uIU/mL  T4, free     Status: None   Collection Time: 12/16/19 11:58 AM  Result Value Ref Range   Free T4 1.23 0.82 - 1.77 ng/dL  Hemoglobin A1c     Status: Abnormal   Collection Time: 12/16/19 11:58 AM  Result Value Ref Range   Hgb A1c MFr Bld 5.9 (H) 4.8 - 5.6 %    Comment:          Prediabetes: 5.7 - 6.4          Diabetes: >6.4          Glycemic control for adults with diabetes: <7.0    Est. average glucose Bld gHb Est-mCnc 123 mg/dL  Lipid panel     Status: Abnormal   Collection Time: 12/16/19 11:58 AM  Result Value Ref  Range   Cholesterol, Total 199 100 - 199 mg/dL   Triglycerides 75 0 - 149 mg/dL   HDL 54 >39 mg/dL   VLDL Cholesterol Cal 14 5 - 40 mg/dL   LDL  Chol Calc (NIH) 131 (H) 0 - 99 mg/dL   Chol/HDL Ratio 3.7 0.0 - 5.0 ratio    Comment:                                   T. Chol/HDL Ratio                                             Men  Women                               1/2 Avg.Risk  3.4    3.3                                   Avg.Risk  5.0    4.4                                2X Avg.Risk  9.6    7.1                                3X Avg.Risk 23.4   11.0   VITAMIN D 25 Hydroxy (Vit-D Deficiency, Fractures)     Status: Abnormal   Collection Time: 12/16/19 11:58 AM  Result Value Ref Range   Vit D, 25-Hydroxy 16.1 (L) 30.0 - 100.0 ng/mL    Comment: Vitamin D deficiency has been defined by the [place] practice guideline as a level of serum 25-OH vitamin D less than 20 ng/mL (1,2). The Endocrine Society went on to further define vitamin D insufficiency as a level between 21 and 29 ng/mL (2). 1. IOM (Institute of Medicine). 2010. Dietary reference    intakes for calcium and D. [place]: The    [employer]. 2. Holick MF, Binkley [place], Bischoff-Ferrari HA, et al.    Evaluation, treatment, and prevention of vitamin D    deficiency: an Endocrine Society clinical practice    guideline. JCEM. 2011 Jul; 96(7):1911-30.      Impression and Recommendations:    1. HTN, goal below 130/80   2. Hyperlipidemia, unspecified hyperlipidemia type- NEW ONSET   3. Prediabetes-  new onset   4. 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 Vitamin D deficiency      - Reviewed recent lab work (12/16/2019) in depth with patient today.  All lab work within normal limits unless otherwise noted.  Extensive education provided and all questions answered.  Chronic Leukopenia - Stable at this time. - Continue management as established. - Will continue to monitor alongside specialist.  Hypertension - Goal Below 130/80 - Blood pressure currently is elevated, unstable, above  goal. - Recommended beginning blood pressure medications today.  - To preserve kidney and organ health and prevent further damage, discussed critical importance of appropriate control of blood pressure  with goal of 135/85 or less.  - Begin low-dose BP medication as recommended.  See med list. - If needed, will increase to full tablet of medication PRN.  - In addition to prescription intervention, counseled patient on pathophysiology of disease and discussed various treatment options, which always includes dietary and lifestyle modification as first line.   - Lifestyle changes such as dash and heart healthy diets and engaging in a regular exercise program discussed extensively with patient.   - Discussed importance of adequate hydration when it comes to blood pressure and preservation of kidney health.  - Ambulatory blood pressure monitoring encouraged at least 3 times weekly.  Keep log of BP and pulse and bring in every office visit.  Reminded patient that if they ever feel poorly in any way, to check their blood pressure and pulse.  - Handouts provided at patient's desire and/or told to go online at the [place] website for further information  - We will continue to monitor  Prediabetes - New Onset - A1c last checked at 5.9, elevated. - Discussed goal of maintenance below 5.7.  - Counseled patient on prevention of diabetes and discussed dietary and lifestyle modification as first line.    - Importance of low carb, heart-healthy diet discussed with patient in addition to regular aerobic exercise of 20mn 5d/week or more.   - Educational handouts provided to patient today.  Extensive health education provided today.  - We will continue to monitor and re-check as discussed.  Vitamin D Deficiency - 16.1 last check, low. - Discussed goal of maintenance in range of 40-60. - Reviewed that adequate vitamin D may improve mood, improve body aches, reduce chances of certain  cancers, and reduce chances of contracting COVID-19. - Encouraged patient to begin 5000 IU's daily OTC.  - Will continue to monitor and re-check as discussed.  Hyperlipidemia - New Onset - FLP last obtained 7 days ago: Triglycerides = 75, WNL. HDL = 54, WNL. LDL = 131, elevated.  - LDL currently elevated. The 10-year ASCVD risk score (Mikey BussingDC Jr., et al., 2013) is: 9%  - Given ASCVD risk, advised beginning cholesterol medications at this time.  - Prudent dietary changes such as low saturated & trans fat diets for hyperlipidemia and low carb diets for hypertriglyceridemia discussed with patient.  Patient prefers to modify his diet and lifestyle and re-check in 3-4 months.  - To help improve HDL, encouraged patient to follow AHA guidelines for regular exercise and also engage in weight loss if BMI above 25.   - Educational handouts provided at patient's desire and/ or told to look online at the [facility] [facility] for further information.  - We will continue to monitor and re-check as discussed.  BMI Counseling - Body mass index is 29.98 kg/m Explained to patient what BMI refers to, and what it means medically.    Told patient to think about it as a "medical risk stratification measurement" and how increasing BMI is associated with increasing risk/ or worsening state of various diseases such as hypertension, hyperlipidemia, diabetes, premature OA, depression etc.  American Heart Association guidelines for healthy diet, basically Mediterranean diet, and exercise guidelines of 30 minutes 5 days per week or more discussed in detail.  Health counseling performed.  All questions answered.  Health Counseling & Preventative Maintenance - Advised patient to continue working toward exercising to improve overall mental, physical, and emotional health.    - Reviewed the "spokes of the wheel" of mood and  health management.  Stressed the importance of ongoing prudent habits,  including regular exercise, appropriate sleep hygiene, healthful dietary habits, and prayer/meditation to relax.  - Encouraged patient to engage in daily physical activity as tolerated, especially a formal exercise routine.  Recommended that the patient eventually strive for at least 150 minutes of moderate cardiovascular activity per week according to guidelines established by the [facility].   - Healthy dietary habits encouraged, including low-carb, and high amounts of lean protein in diet.   - Patient should also consume adequate amounts of water.  Recommendations - Continue to monitor BP at home with goal of 135/85 or less. - Return in 3-4 months for lab work, Vitamin D, A1c, FLP, sooner if BP not controlled.    - As part of my medical decision making, I reviewed the following data within the [facility] History obtained from pt /family, CMA notes reviewed and incorporated if applicable, Labs reviewed, Radiograph/ tests reviewed if applicable and OV notes from prior OV's with me, as well as other specialists she/he has seen since seeing me last, were all reviewed and used in my medical decision making process today.   - Additionally, discussion had with patient regarding txmnt plan, their biases about that plan etc were used in my medical decision making today.   - The patient agreed with the plan and demonstrated an understanding of the instructions.   No barriers to understanding were identified.    - Red flag symptoms and signs discussed in detail.  Patient expressed understanding regarding what to do in case of emergency\ urgent symptoms.  The patient was advised to call back or seek an in-person evaluation if the symptoms worsen or if the condition fails to improve as anticipated.   Return for re-check Vitamin D, A1c, FLP, BMP in 3-4 mo.If BP is not 135/85 or less, return OV sooner.    Orders Placed This Encounter  Procedures  . Basic metabolic panel  . Lipid panel  .  Hemoglobin A1c  . VITAMIN D 25 Hydroxy (Vit-D Deficiency, Fractures)    Meds ordered this encounter  Medications  . Olmesartan-amLODIPine-HCTZ 20-5-12.5 MG TABS    Sig: 0.5 tab po qd    Dispense:  45 tablet    Refill:  0  . Cholecalciferol (DIALYVITE VITAMIN D 5000) 125 MCG (5000 UT) capsule    Sig: 1 tablet by mouth daily.     Note:  This note was prepared with assistance of Dragon voice recognition software. Occasional wrong-word or sound-a-like substitutions may have occurred due to the inherent limitations of voice recognition software.   This document serves as a record of services personally performed by Mellody Dance, DO. It was created on her behalf by Toni Amend, a trained medical scribe. The creation of this record is based on the scribe's personal observations and the provider's statements to them.   This case required medical decision making of at least moderate complexity. The above documentation has been reviewed to be accurate and was completed by Marjory Sneddon, D.O.       Patient Care Team    Relationship Specialty Notifications Start End  Mellody Dance, DO PCP - General Family Medicine  12/16/19   Heath Lark, MD Consulting Physician Hematology and Oncology  10/21/13   Ladene Artist, MD Consulting Physician Gastroenterology  12/16/19   Nobie Putnam, MD Consulting Physician Oncology  12/16/19     -Vitals obtained; medications/ allergies reconciled;  personal medical, social, Sx etc.histories were  updated by CMA, reviewed by me and are reflected in chart  Patient Active Problem List   Diagnosis Date Noted  . Hyperlipidemia 12/23/2019  . Asymptomatic hypertensive urgency vs white coat 12/16/2019  . Chronic leukopenia 11/12/2015  . Family history of cerebrovascular accident (CVA) in father 12/16/2019  . Family history of hypertension 12/16/2019  . Family history of diabetes mellitus in first degree relative 12/16/2019  . Prediabetes  12/23/2019  . HTN, goal below 130/80 12/23/2019  . History of noncompliance with medical treatment 12/16/2019  . Family history of vitamin D deficiency 12/16/2019  . Preventive measure 11/16/2015  . Leukocytopenia      No outpatient medications have been marked as taking for the 12/23/19 encounter (Office Visit) with Mellody Dance, DO.   Current Facility-Administered Medications for the 12/23/19 encounter (Office Visit) with Mellody Dance, DO  Medication  . 0.9 %  sodium chloride infusion  . 0.9 %  sodium chloride infusion     No Known Allergies   ROS:  See above HPI for pertinent positives and negatives   Objective:   Blood pressure (!) 150/93, height '5\' 9"'  (1.753 m), weight 203 lb (92.1 kg).  (if some vitals are omitted, this means that patient was UNABLE to obtain them even though they were asked to get them prior to OV today.  They were asked to call us at their earliest convenience with these once obtained.)  General: A & O * 3; visually in no acute distress; in usual state of health.  Skin: Visible skin appears normal and pt's usual skin color HEENT:  EOMI, head is normocephalic and atraumatic.  Sclera are anicteric. Neck has a good range of motion.  Lips are noncyanotic Chest: normal chest excursion and movement Respiratory: speaking in full sentences, no conversational dyspnea; no use of accessory muscles Psych: insight good, mood- appears full

## 2019-12-23 NOTE — Patient Instructions (Addendum)
Risk factors for prediabetes and type 2 diabetes  Researchers don't fully understand why some people develop prediabetes and type 2 diabetes and others don't.  It's clear that certain factors increase the risk, however, including:  Weight. The more fatty tissue you have, the more resistant your cells become to insulin.  Inactivity. The less active you are, the greater your risk. Physical activity helps you control your weight, uses up glucose as energy and makes your cells more sensitive to insulin.  Family history. Your risk increases if a parent or sibling has type 2 diabetes.  Race. Although it's unclear why, people of certain races -- including blacks, Hispanics, American Indians and Asian-Americans -- are at higher risk.  Age. Your risk increases as you get older. This may be because you tend to exercise less, lose muscle mass and gain weight as you age. But type 2 diabetes is also increasing dramatically among children, adolescents and younger adults.  Gestational diabetes. If you developed gestational diabetes when you were pregnant, your risk of developing prediabetes and type 2 diabetes later increases. If you gave birth to a baby weighing more than 9 pounds (4 kilograms), you're also at risk of type 2 diabetes.  Polycystic ovary syndrome. For women, having polycystic ovary syndrome -- a common condition characterized by irregular menstrual periods, excess hair growth and obesity -- increases the risk of diabetes.  High blood pressure. Having blood pressure over 140/90 millimeters of mercury (mm Hg) is linked to an increased risk of type 2 diabetes.  Abnormal cholesterol and triglyceride levels. If you have low levels of high-density lipoprotein (HDL), or "good," cholesterol, your risk of type 2 diabetes is higher. Triglycerides are another type of fat carried in the blood. People with high levels of triglycerides have an increased risk of type 2 diabetes. Your doctor can let you know  what your cholesterol and triglyceride levels are.  A good guide to good carbs: The glycemic index ---If you have diabetes, or at risk for diabetes, you know all too well that when you eat carbohydrates, your blood sugar goes up. The total amount of carbs you consume at a meal or in a snack mostly determines what your blood sugar will do. But the food itself also plays a role. A serving of white rice has almost the same effect as eating pure table sugar -- a quick, high spike in blood sugar. A serving of lentils has a slower, smaller effect.  ---Picking good sources of carbs can help you control your blood sugar and your weight. Even if you don't have diabetes, eating healthier carbohydrate-rich foods can help ward off a host of chronic conditions, from heart disease to various cancers to, well, diabetes.  ---One way to choose foods is with the glycemic index (GI). This tool measures how much a food boosts blood sugar.  The glycemic index rates the effect of a specific amount of a food on blood sugar compared with the same amount of pure glucose. A food with a glycemic index of 28 boosts blood sugar only 28% as much as pure glucose. One with a GI of 95 acts like pure glucose.    High glycemic foods result in a quick spike in insulin and blood sugar (also known as blood glucose).  Low glycemic foods have a slower, smaller effect- these are healthier for you.   Using the glycemic index Using the glycemic index is easy: choose foods in the low GI category instead of  those in the high GI category (see below), and go easy on those in between. Low glycemic index (GI of 55 or less): Most fruits and vegetables, beans, minimally processed grains, pasta, low-fat dairy foods, and nuts.  Moderate glycemic index (GI 56 to 69): White and sweet potatoes, corn, white rice, couscous, breakfast cereals such as Cream of Wheat and Mini Wheats.  High glycemic index (GI of 70 or higher): White bread, rice cakes, most  crackers, bagels, cakes, doughnuts, croissants, most packaged breakfast cereals. You can see the values for 100 commons foods and get links to more at www.health.CheapToothpicks.si.  Swaps for lowering glycemic index  Instead of this high-glycemic index food Eat this lower-glycemic index food  White rice Brown rice or converted rice  Instant oatmeal Steel-cut oats  Cornflakes Bran flakes  Baked potato Pasta, bulgur  White bread Whole-grain bread  Corn Peas or leafy greens       Prediabetes Eating Plan  Prediabetes--also called impaired glucose tolerance or impaired fasting glucose--is a condition that causes blood sugar (blood glucose) levels to be higher than normal. Following a healthy diet can help to keep prediabetes under control. It can also help to lower the risk of type 2 diabetes and heart disease, which are increased in people who have prediabetes. Along with regular exercise, a healthy diet:  Promotes weight loss.  Helps to control blood sugar levels.  Helps to improve the way that the body uses insulin.   WHAT DO I NEED TO KNOW ABOUT THIS EATING PLAN?   Use the glycemic index (GI) to plan your meals. The index tells you how quickly a food will raise your blood sugar. Choose low-GI foods. These foods take a longer time to raise blood sugar.  Pay close attention to the amount of carbohydrates in the food that you eat. Carbohydrates increase blood sugar levels.  Keep track of how many calories you take in. Eating the right amount of calories will help you to achieve a healthy weight. Losing about 7 percent of your starting weight can help to prevent type 2 diabetes.  You may want to follow a Mediterranean diet. This diet includes a lot of vegetables, lean meats or fish, whole grains, fruits, and healthy oils and fats.   WHAT FOODS CAN I EAT?  Grains Whole grains, such as whole-wheat or whole-grain breads, crackers, cereals, and pasta. Unsweetened oatmeal. Bulgur.  Barley. Quinoa. Brown rice. Corn or whole-wheat flour tortillas or taco shells. Vegetables Lettuce. Spinach. Peas. Beets. Cauliflower. Cabbage. Broccoli. Carrots. Tomatoes. Squash. Eggplant. Herbs. Peppers. Onions. Cucumbers. Brussels sprouts. Fruits Berries. Bananas. Apples. Oranges. Grapes. Papaya. Mango. Pomegranate. Kiwi. Grapefruit. Cherries. Meats and Other Protein Sources Seafood. Lean meats, such as chicken and [place] or lean cuts of pork and beef. Tofu. Eggs. Nuts. Beans. Dairy Low-fat or fat-free dairy products, such as yogurt, cottage cheese, and cheese. Beverages Water. Tea. Coffee. Sugar-free or diet soda. Seltzer water. Milk. Milk alternatives, such as soy or almond milk. Condiments Mustard. Relish. Low-fat, low-sugar ketchup. Low-fat, low-sugar barbecue sauce. Low-fat or fat-free mayonnaise. Sweets and Desserts Sugar-free or low-fat pudding. Sugar-free or low-fat ice cream and other frozen treats. Fats and Oils Avocado. Walnuts. Olive oil. The items listed above may not be a complete list of recommended foods or beverages. Contact your dietitian for more options.    WHAT FOODS ARE NOT RECOMMENDED?  Grains Refined white flour and flour products, such as bread, pasta, snack foods, and cereals. Beverages Sweetened drinks, such as sweet iced tea and  soda. Sweets and Desserts Baked goods, such as cake, cupcakes, pastries, cookies, and cheesecake. The items listed above may not be a complete list of foods and beverages to avoid. Contact your dietitian for more information.   This information is not intended to replace advice given to you by your health care provider. Make sure you discuss any questions you have with your health care provider.   Document Released: 03/31/2015 Document Reviewed: 03/31/2015 Elsevier Interactive Patient Education 2016 [place] for a Low Cholesterol, Low Saturated Fat Diet  Fats - Limit total intake of fats and  oils. - Avoid butter, stick margarine, shortening, lard, palm and coconut oils. - Limit mayonnaise, salad dressings, gravies and sauces, unless they are homemade with low-fat ingredients. - Limit chocolate. - Choose low-fat and nonfat products, such as low-fat mayonnaise, low-fat or non-hydrogenated peanut butter, low-fat or fat-free salad dressings and nonfat gravy. - Use vegetable oil, such as canola or olive oil. - Look for margarine that does not contain trans fatty acids. - Use nuts in moderate amounts. - Read ingredient labels carefully to determine both amount and type of fat present in foods. Limit saturated and trans fats! - Avoid high-fat processed and convenience foods.  Meats and Meat Alternatives - Choose fish, chicken, [place] and lean meats. - Use dried beans, peas, lentils and tofu. - Limit egg yolks to three to four per week. - If you eat red meat, limit to no more than three servings per week and choose loin or round cuts. - Avoid fatty meats, such as bacon, sausage, franks, luncheon meats and ribs. - Avoid all organ meats, including liver.  Dairy - Choose nonfat or low-fat milk, yogurt and cottage cheese. - Most cheeses are high in fat. Choose cheeses made from non-fat milk, such as mozzarella and ricotta cheese. - Choose light or fat-free cream cheese and sour cream. - Avoid cream and sauces made with cream.  Fruits and Vegetables - Eat a wide variety of fruits and vegetables. - Use lemon juice, vinegar or "mist" olive oil on vegetables. - Avoid adding sauces, fat or oil to vegetables.  Breads, Cereals and Grains - Choose whole-grain breads, cereals, pastas and rice. - Avoid high-fat snack foods, such as granola, cookies, pies, pastries, doughnuts and croissants.  Cooking Tips - Avoid deep fried foods. - Trim visible fat off meats and remove skin from poultry before cooking. - Bake, broil, boil, poach or roast poultry, fish and lean meats. - Drain and discard  fat that drains out of meat as you cook it. - Add little or no fat to foods. - Use vegetable oil sprays to grease pans for cooking or baking. - Steam vegetables. - Use herbs or no-oil marinades to flavor foods.

## 2020-03-15 ENCOUNTER — Other Ambulatory Visit: Payer: Self-pay | Admitting: Family Medicine

## 2020-04-12 NOTE — Progress Notes (Signed)
Telehealth office visit note for Jay Reid, PA-C- at [facility] at [facility]   I connected with Jay Arnold by telephone and verified that I am speaking with the correct person using two identifiers.   . Location of the patient: Home . Location of the provider: Office - This visit type was conducted due to national recommendations for restrictions regarding the COVID-19 Pandemic (e.g. social distancing) in an effort to limit this patient's exposure and mitigate transmission in our community.    - No physical exam could be performed with this format, beyond that communicated to Jay Arnold by the patient/ family members as noted.   - Additionally my office staff/ schedulers were to discuss with the patient that there may be a monetary charge related to this service, depending on their medical insurance.  My understanding is that patient understood and consented to proceed.     _________________________________________________________________________________   History of Present Illness: Pt calls in to follow-up on hypertension and hyperlipidemia. Pt reports he is doing well.  HTN: Pt denies chest pain, palpitations, dizziness or lower extremity swelling. Taking medication as directed without side effects. Checks BP at home and readings range in 130/80s. Pt follows a low salt diet.  HLD: Pt reports he was doing better with reducing cholesterol/sat and trans fat. He is physically active at least 1 hr/day 5 times/wk which consists of walking, running, doing weight lifting, push-ups and sit-ups.     No flowsheet data found.  Depression screen [facility] 2/9 04/13/2020 12/23/2019 12/16/2019  Decreased Interest 0 0 0  Down, Depressed, Hopeless 0 0 0  PHQ - 2 Score 0 0 0  Altered sleeping 1 0 0  Tired, decreased energy 1 0 1  Change in appetite 0 0 0  Feeling bad or failure about yourself  0 0 0  Trouble concentrating 1 0 0  Moving slowly or fidgety/restless 0 0 0  Suicidal thoughts 0 0 0   PHQ-9 Score 3 0 1  Difficult doing work/chores Not difficult at all - Not difficult at all      Impression and Recommendations:     1. HTN, goal below 130/80   2. Hyperlipidemia, unspecified hyperlipidemia type- NEW ONSET     HTN: - BP today is 142/85, above goal but improved from previous BP OV readings. - Continue Olmesartan-amlodipine-HCTZ 20-5-12.5 mg. Refills provided. - Continue ambulatory BP and pulse monitoring and keep a log. - Encourage to continue DASH diet and moderate-intensity and toning activities. - Stay well hydrated- at least 64 fl oz .  HLD: - Last lipid panel wnl's except elevated LDL at 131 - Encourage to be more diligent with following a heart healthy diet and will recheck lipid panel at next OV. - Continue physical activity.     - As part of my medical decision making, I reviewed the following data within the [place] History obtained from pt /family, CMA notes reviewed and incorporated if applicable, Labs reviewed, Radiograph/ tests reviewed if applicable and OV notes from prior OV's with me, as well as any other specialists she/he has seen since seeing me last, were all reviewed and used in my medical decision making process today.    - Additionally, when appropriate, discussion had with patient regarding our treatment plan, and their biases/concerns about that plan were used in my medical decision making today.    - The patient agreed with the plan and demonstrated an understanding of the instructions.   No barriers  to understanding were identified.     - The patient was advised to call back or seek an in-person evaluation if the symptoms worsen or if the condition fails to improve as anticipated.   Return in about 3 months (around 07/14/2020) for HTN, HLD and FBW (Lipids, CMP).    No orders of the defined types were placed in this encounter.   No orders of the defined types were placed in this encounter.   There are no  discontinued medications.     Time spent on visit including pre-visit chart review and post-visit care was 9 minutes.      The [place] was signed into law in 2016 which includes the topic of electronic health records.  This provides immediate access to information in MyChart.  This includes consultation notes, operative notes, office notes, lab results and pathology reports.  If you have any questions about what you read please let Jay Arnold know at your next visit or call Jay Arnold at the office.  We are right here with you.   __________________________________________________________________________________     Patient Care Team    Relationship Specialty Notifications Start End  Jay Arnold, [place] PCP - General   03/29/20   Heath Lark, MD Consulting Physician Hematology and Oncology  10/21/13   Ladene Artist, MD Consulting Physician Gastroenterology  12/16/19   Nobie Putnam, MD Consulting Physician Oncology  12/16/19      -Vitals obtained; medications/ allergies reconciled;  personal medical, social, Sx etc.histories were updated by CMA, reviewed by me and are reflected in chart   Patient Active Problem List   Diagnosis Date Noted  . Hyperlipidemia 12/23/2019  . Prediabetes 12/23/2019  . HTN, goal below 130/80 12/23/2019  . Asymptomatic hypertensive urgency vs white coat 12/16/2019  . Family history of cerebrovascular accident (CVA) in father 12/16/2019  . Family history of hypertension 12/16/2019  . Family history of diabetes mellitus in first degree relative 12/16/2019  . History of noncompliance with medical treatment 12/16/2019  . Family history of vitamin D deficiency 12/16/2019  . Preventive measure 11/16/2015  . Chronic leukopenia 11/12/2015  . Leukocytopenia      Current Meds  Medication Sig  . Cholecalciferol (DIALYVITE VITAMIN D 5000) 125 MCG (5000 UT) capsule 1 tablet by mouth daily.  . Olmesartan-amLODIPine-HCTZ 20-5-12.5 MG TABS TAKE 1/2 TABLET BY  MOUTH EVERY DAY**PATIENT NEEDS APT FOR FURTHER REFILLS**   Current Facility-Administered Medications for the 04/13/20 encounter (Telemedicine) with Jay Reid, PA-C  Medication  . 0.9 %  sodium chloride infusion  . 0.9 %  sodium chloride infusion     Allergies:  No Known Allergies   ROS:  See above HPI for pertinent positives and negatives   Objective:   Blood pressure (!) 142/85, pulse 62, temperature (!) 97.3 F (36.3 C), temperature source Temporal, height 5\' 9"  (1.753 m), weight 200 lb (90.7 kg).  (if some vitals are omitted, this means that patient was UNABLE to obtain them even though they were asked to get them prior to OV today.  They were asked to call Jay Arnold at their earliest convenience with these once obtained. ) General: A & O * 3; sounds in no acute distress; in usual state of health.  Respiratory: speaking in full sentences, no conversational dyspnea; patient confirms no use of accessory muscles Psych: insight appears good, mood- appears full

## 2020-04-13 ENCOUNTER — Telehealth (INDEPENDENT_AMBULATORY_CARE_PROVIDER_SITE_OTHER): Payer: 59 | Admitting: Physician Assistant

## 2020-04-13 ENCOUNTER — Encounter: Payer: Self-pay | Admitting: Physician Assistant

## 2020-04-13 ENCOUNTER — Other Ambulatory Visit: Payer: Self-pay

## 2020-04-13 VITALS — BP 142/85 | HR 62 | Temp 97.3°F | Ht 69.0 in | Wt 200.0 lb

## 2020-04-13 DIAGNOSIS — E785 Hyperlipidemia, unspecified: Secondary | ICD-10-CM | POA: Diagnosis not present

## 2020-04-13 DIAGNOSIS — I10 Essential (primary) hypertension: Secondary | ICD-10-CM | POA: Diagnosis not present

## 2020-04-13 MED ORDER — OLMESARTAN-AMLODIPINE-HCTZ 20-5-12.5 MG PO TABS: ORAL_TABLET | ORAL | 0 refills | Status: DC

## 2020-10-21 ENCOUNTER — Telehealth: Payer: Self-pay | Admitting: Physician Assistant

## 2020-10-21 ENCOUNTER — Other Ambulatory Visit: Payer: Self-pay | Admitting: Physician Assistant

## 2020-10-21 DIAGNOSIS — I10 Essential (primary) hypertension: Secondary | ICD-10-CM

## 2020-10-21 NOTE — Telephone Encounter (Signed)
Please call patient to schedule apt for further refills. AS, CMA 

## 2021-01-18 ENCOUNTER — Telehealth: Payer: Self-pay | Admitting: Physician Assistant

## 2021-01-18 ENCOUNTER — Other Ambulatory Visit: Payer: Self-pay | Admitting: Physician Assistant

## 2021-01-18 DIAGNOSIS — I10 Essential (primary) hypertension: Secondary | ICD-10-CM

## 2021-01-18 NOTE — Telephone Encounter (Signed)
Please contact pt to schedule apt per last AVS for further med refills. AS, CMA 

## 2021-01-18 NOTE — Telephone Encounter (Signed)
Spoke with patient's wife and she states Roddie will call back and schedule an appointment.

## 2021-02-19 ENCOUNTER — Other Ambulatory Visit: Payer: Self-pay | Admitting: Physician Assistant

## 2021-02-19 DIAGNOSIS — I10 Essential (primary) hypertension: Secondary | ICD-10-CM

## 2021-02-22 ENCOUNTER — Telehealth: Payer: Self-pay | Admitting: Physician Assistant

## 2021-02-22 NOTE — Telephone Encounter (Signed)
Left voicemail for patient

## 2021-02-22 NOTE — Telephone Encounter (Signed)
Please contact patient to schedule apt per last AVS. Refills denied per refill protocol. AS, CMA

## 2021-03-04 ENCOUNTER — Encounter: Payer: Self-pay | Admitting: Nurse Practitioner

## 2021-03-04 ENCOUNTER — Other Ambulatory Visit: Payer: Self-pay

## 2021-03-04 ENCOUNTER — Ambulatory Visit (INDEPENDENT_AMBULATORY_CARE_PROVIDER_SITE_OTHER): Payer: No Typology Code available for payment source | Admitting: Nurse Practitioner

## 2021-03-04 VITALS — BP 148/73 | HR 64 | Temp 97.0°F | Ht 69.0 in | Wt 211.6 lb

## 2021-03-04 DIAGNOSIS — E785 Hyperlipidemia, unspecified: Secondary | ICD-10-CM | POA: Diagnosis not present

## 2021-03-04 DIAGNOSIS — R7303 Prediabetes: Secondary | ICD-10-CM | POA: Diagnosis not present

## 2021-03-04 DIAGNOSIS — E559 Vitamin D deficiency, unspecified: Secondary | ICD-10-CM

## 2021-03-04 DIAGNOSIS — Z23 Encounter for immunization: Secondary | ICD-10-CM

## 2021-03-04 DIAGNOSIS — I10 Essential (primary) hypertension: Secondary | ICD-10-CM | POA: Diagnosis not present

## 2021-03-04 DIAGNOSIS — Z0000 Encounter for general adult medical examination without abnormal findings: Secondary | ICD-10-CM | POA: Insufficient documentation

## 2021-03-04 DIAGNOSIS — Z1159 Encounter for screening for other viral diseases: Secondary | ICD-10-CM

## 2021-03-04 NOTE — Patient Instructions (Signed)
Managing Your Hypertension Hypertension, also called high blood pressure, is when the force of the blood pressing against the walls of the arteries is too strong. Arteries are blood vessels that carry blood from your heart throughout your body. Hypertension forces the heart to work harder to pump blood and may cause the arteries to become narrow or stiff. Understanding blood pressure readings Your personal target blood pressure may vary depending on your medical conditions, your age, and other factors. A blood pressure reading includes a higher number over a lower number. Ideally, your blood pressure should be below 120/80. You should know that:  The first, or top, number is called the systolic pressure. It is a measure of the pressure in your arteries as your heart beats.  The second, or bottom number, is called the diastolic pressure. It is a measure of the pressure in your arteries as the heart relaxes. Blood pressure is classified into four stages. Based on your blood pressure reading, your health care provider may use the following stages to determine what type of treatment you need, if any. Systolic pressure and diastolic pressure are measured in a unit called mmHg. Normal  Systolic pressure: below 120.  Diastolic pressure: below 80. Elevated  Systolic pressure: 120-129.  Diastolic pressure: below 80. Hypertension stage 1  Systolic pressure: 130-139.  Diastolic pressure: 80-89. Hypertension stage 2  Systolic pressure: 140 or above.  Diastolic pressure: 90 or above. How can this condition affect me? Managing your hypertension is an important responsibility. Over time, hypertension can damage the arteries and decrease blood flow to important parts of the body, including the brain, heart, and kidneys. Having untreated or uncontrolled hypertension can lead to:  A heart attack.  A stroke.  A weakened blood vessel (aneurysm).  Heart failure.  Kidney damage.  Eye  damage.  Metabolic syndrome.  Memory and concentration problems.  Vascular dementia. What actions can I take to manage this condition? Hypertension can be managed by making lifestyle changes and possibly by taking medicines. Your health care provider will help you make a plan to bring your blood pressure within a normal range. Nutrition  Eat a diet that is high in fiber and potassium, and low in salt (sodium), added sugar, and fat. An example eating plan is called the Dietary Approaches to Stop Hypertension (DASH) diet. To eat this way: ? Eat plenty of fresh fruits and vegetables. Try to fill one-half of your plate at each meal with fruits and vegetables. ? Eat whole grains, such as whole-wheat pasta, brown rice, or whole-grain bread. Fill about one-fourth of your plate with whole grains. ? Eat low-fat dairy products. ? Avoid fatty cuts of meat, processed or cured meats, and poultry with skin. Fill about one-fourth of your plate with lean proteins such as fish, chicken without skin, beans, eggs, and tofu. ? Avoid pre-made and processed foods. These tend to be higher in sodium, added sugar, and fat.  Reduce your daily sodium intake. Most people with hypertension should eat less than 1,500 mg of sodium a day.   Lifestyle  Work with your health care provider to maintain a healthy body weight or to lose weight. Ask what an ideal weight is for you.  Get at least 30 minutes of exercise that causes your heart to beat faster (aerobic exercise) most days of the week. Activities may include walking, swimming, or biking.  Include exercise to strengthen your muscles (resistance exercise), such as weight lifting, as part of your weekly exercise routine. Try   to do these types of exercises for 30 minutes at least 3 days a week.  Do not use any products that contain nicotine or tobacco, such as cigarettes, e-cigarettes, and chewing tobacco. If you need help quitting, ask your health care  provider.  Control any long-term (chronic) conditions you have, such as high cholesterol or diabetes.  Identify your sources of stress and find ways to manage stress. This may include meditation, deep breathing, or making time for fun activities.   Alcohol use  Do not drink alcohol if: ? Your health care provider tells you not to drink. ? You are pregnant, may be pregnant, or are planning to become pregnant.  If you drink alcohol: ? Limit how much you use to:  0-1 drink a day for women.  0-2 drinks a day for men. ? Be aware of how much alcohol is in your drink. In the [place] one drink equals one 12 oz bottle of beer (355 mL), one 5 oz glass of wine (148 mL), or one 1 oz glass of hard liquor (44 mL). Medicines Your health care provider may prescribe medicine if lifestyle changes are not enough to get your blood pressure under control and if:  Your systolic blood pressure is 130 or higher.  Your diastolic blood pressure is 80 or higher. Take medicines only as told by your health care provider. Follow the directions carefully. Blood pressure medicines must be taken as told by your health care provider. The medicine does not work as well when you skip doses. Skipping doses also puts you at risk for problems. Monitoring Before you monitor your blood pressure:  Do not smoke, drink caffeinated beverages, or exercise within 30 minutes before taking a measurement.  Use the bathroom and empty your bladder (urinate).  Sit quietly for at least 5 minutes before taking measurements. Monitor your blood pressure at home as told by your health care provider. To do this:  Sit with your back straight and supported.  Place your feet flat on the floor. Do not cross your legs.  Support your arm on a flat surface, such as a table. Make sure your upper arm is at heart level.  Each time you measure, take two or three readings one minute apart and record the results. You may also need to have your  blood pressure checked regularly by your health care provider.   General information  Talk with your health care provider about your diet, exercise habits, and other lifestyle factors that may be contributing to hypertension.  Review all the medicines you take with your health care provider because there may be side effects or interactions.  Keep all visits as told by your health care provider. Your health care provider can help you create and adjust your plan for managing your high blood pressure. Where to find more information  National Heart, Lung, and Blood Institute: www.nhlbi.nih.gov  American Heart Association: www.heart.org Contact a health care provider if:  You think you are having a reaction to medicines you have taken.  You have repeated (recurrent) headaches.  You feel dizzy.  You have swelling in your ankles.  You have trouble with your vision. Get help right away if:  You develop a severe headache or confusion.  You have unusual weakness or numbness, or you feel faint.  You have severe pain in your chest or abdomen.  You vomit repeatedly.  You have trouble breathing. These symptoms may represent a serious problem that is an emergency. Do not wait   to see if the symptoms will go away. Get medical help right away. Call your local emergency services (911 in the [place] Do not drive yourself to the hospital. Summary  Hypertension is when the force of blood pumping through your arteries is too strong. If this condition is not controlled, it may put you at risk for serious complications.  Your personal target blood pressure may vary depending on your medical conditions, your age, and other factors. For most people, a normal blood pressure is less than 120/80.  Hypertension is managed by lifestyle changes, medicines, or both.  Lifestyle changes to help manage hypertension include losing weight, eating a healthy, low-sodium diet, exercising more, stopping smoking, and  limiting alcohol. This information is not intended to replace advice given to you by your health care provider. Make sure you discuss any questions you have with your health care provider. Document Revised: 12/20/2019 Document Reviewed: 10/15/2019 Elsevier Patient Education  2021 Elsevier Inc.  

## 2021-03-04 NOTE — Progress Notes (Signed)
Established Patient Office Visit  Subjective:  Patient ID: Jay Arnold, male    DOB: 1961-01-03  Age: 60 y.o. MRN: [id number]  CC:  Chief Complaint  Patient presents with  . Annual Exam    HPI Jay Arnold presents for his annual wellness visit. His blood pressure is mildly elevated upon arrival to the office. He does monitor his blood pressure consistently at home. It generally runs around 130/80. He does take amlodipine/HCTZ every day. He states that for a few weeks in February, his blood pressure was running 115/70 and was feeling tired and sluggish. He did stop taking the medication for a short period. He states that, gradually, the blood pressure started to creep back up. He has restarted his blood pressure medication again. Takes it around lunch time each day. He tolerates it well.  He denies chest pain, chest pressure, shortness of breath, or headaches. He denies abdominal pain, nausea, vomiting, or changes in bowel or bladder habits.  He is due to have routine, fasting labs done.  Would like to get TDAP vaccine while he is in the office today.   Past Medical History:  Diagnosis Date  . Leukocytopenia   . Leukopenia 11/12/2015    Past Surgical History:  Procedure Laterality Date  . APPENDECTOMY  2010  . Left neck cyst removal  2010  . VASECTOMY  2007    Family History  Problem Relation Age of Onset  . Breast cancer Paternal Grandmother   . Prostate cancer Maternal Uncle        x 2 uncles  . Heart disease Paternal Grandfather   . Heart disease Maternal Grandmother   . Diabetes Father   . Diabetes Sister   . Colon cancer Neg Hx   . Stomach cancer Neg Hx     Social History   Socioeconomic History  . Marital status: Married    Spouse name: Not on file  . Number of children: 2  . Years of education: Not on file  . Highest education level: Not on file  Occupational History  . Occupation: Nurse, children's: [employer]  Tobacco Use  . Smoking  status: Never Smoker  . Smokeless tobacco: Never Used  Substance and Sexual Activity  . Alcohol use: Yes    Comment: rare  . Drug use: No  . Sexual activity: Not on file  Other Topics Concern  . Not on file  Social History Narrative  . Not on file   Social Determinants of Health   Financial Resource Strain: Not on file  Food Insecurity: Not on file  Transportation Needs: Not on file  Physical Activity: Not on file  Stress: Not on file  Social Connections: Not on file  Intimate Partner Violence: Not on file    Outpatient Medications Prior to Visit  Medication Sig Dispense Refill  . Cholecalciferol (DIALYVITE VITAMIN D 5000) 125 MCG (5000 UT) capsule 1 tablet by mouth daily.    . Olmesartan-amLODIPine-HCTZ 20-5-12.5 MG TABS Take 0.5 tablets by mouth daily. **NEEDS APT FOR REFILLS** 15 tablet 0   Facility-Administered Medications Prior to Visit  Medication Dose Route Frequency Provider Last Rate Last Admin  . 0.9 %  sodium chloride infusion  500 mL Intravenous Continuous Lucio Edward T, MD      . 0.9 %  sodium chloride infusion  500 mL Intravenous Continuous Ladene Artist, MD        No Known Allergies  ROS Review of Systems  Constitutional: Negative for  activity change, chills and fever.  HENT: Negative for congestion, postnasal drip, rhinorrhea and sinus pressure.   Eyes: Negative.   Respiratory: Negative for cough, shortness of breath and wheezing.   Cardiovascular: Negative for chest pain and palpitations.  Gastrointestinal: Negative for constipation, diarrhea, nausea and vomiting.  Endocrine: Negative.   Musculoskeletal: Negative for arthralgias, back pain and myalgias.  Skin: Negative for rash.  Allergic/Immunologic: Negative for environmental allergies.  Neurological: Negative for dizziness, weakness and headaches.  Hematological: Negative for adenopathy.  Psychiatric/Behavioral: The patient is not nervous/anxious.   All other systems reviewed and are  negative.     Objective:    Physical Exam Vitals and nursing note reviewed.  Constitutional:      Appearance: Normal appearance. He is well-developed.  HENT:     Head: Normocephalic and atraumatic.     Right Ear: Ear canal and external ear normal.     Left Ear: Ear canal and external ear normal.     Nose: Nose normal.  Eyes:     Extraocular Movements: Extraocular movements intact.     Conjunctiva/sclera: Conjunctivae normal.     Pupils: Pupils are equal, round, and reactive to light.  Neck:     Vascular: No carotid bruit.  Cardiovascular:     Rate and Rhythm: Normal rate and regular rhythm.     Pulses: Normal pulses.     Heart sounds: Normal heart sounds.  Pulmonary:     Effort: Pulmonary effort is normal.     Breath sounds: Normal breath sounds.  Abdominal:     General: Bowel sounds are normal.     Palpations: Abdomen is soft.     Tenderness: There is no abdominal tenderness.  Musculoskeletal:        General: Normal range of motion.     Cervical back: Normal range of motion and neck supple.  Skin:    General: Skin is warm and dry.     Capillary Refill: Capillary refill takes less than 2 seconds.  Neurological:     General: No focal deficit present.     Mental Status: He is alert and oriented to person, place, and time.     Sensory: No sensory deficit.     Motor: No weakness.  Psychiatric:        Mood and Affect: Mood normal.        Behavior: Behavior normal.        Thought Content: Thought content normal.        Judgment: Judgment normal.     Today's Vitals   03/04/21 0838  BP: (!) 148/73  Pulse: 64  Temp: (!) 97 F (36.1 C)  SpO2: 99%  Weight: 211 lb 9.6 oz (96 kg)  Height: 5\' 9"  (1.753 m)   Body mass index is 31.25 kg/m.   Wt Readings from Last 3 Encounters:  03/04/21 211 lb 9.6 oz (96 kg)  04/13/20 200 lb (90.7 kg)  12/23/19 203 lb (92.1 kg)     Health Maintenance Due  Topic Date Due  . Hepatitis C Screening  Never done  . COVID-19 Vaccine  (1) Never done    There are no preventive care reminders to display for this patient.  Lab Results  Component Value Date   TSH 1.450 12/16/2019   Lab Results  Component Value Date   WBC 3.6 12/16/2019   HGB 14.3 12/16/2019   HCT 40.1 12/16/2019   MCV 86 12/16/2019   PLT 182 12/16/2019   Lab Results  Component  Value Date   NA 142 12/16/2019   K 4.7 12/16/2019   CHLORIDE 107 10/21/2013   CO2 28 12/16/2019   GLUCOSE 86 12/16/2019   BUN 14 12/16/2019   CREATININE 1.14 12/16/2019   BILITOT 0.6 12/16/2019   ALKPHOS 54 12/16/2019   AST 24 12/16/2019   ALT 21 12/16/2019   PROT 7.1 12/16/2019   ALBUMIN 4.5 12/16/2019   CALCIUM 9.1 12/16/2019   ANIONGAP 9 10/21/2013   Lab Results  Component Value Date   CHOL 199 12/16/2019   Lab Results  Component Value Date   HDL 54 12/16/2019   Lab Results  Component Value Date   LDLCALC 131 (H) 12/16/2019   Lab Results  Component Value Date   TRIG 75 12/16/2019   Lab Results  Component Value Date   CHOLHDL 3.7 12/16/2019   Lab Results  Component Value Date   HGBA1C 5.9 (H) 12/16/2019      Assessment & Plan:  1. Healthcare maintenance Annual wellness visit today. Routine, fasting labs drawn at time of visit  - Hepatitis C Antibody - CBC - Comprehensive metabolic panel - TSH - Lipid panel - Hemoglobin A1c - VITAMIN D 25 Hydroxy (Vit-D Deficiency, Fractures)  2. HTN, goal below 130/80 Blood pressure elevated this morning. Patient will monitor daily at home. Will notify office if it is consistently 140/90.  - CBC - Comprehensive metabolic panel  3. Hyperlipidemia, unspecified hyperlipidemia type Check fasting lipid panel and treat as indicated.  - Lipid panel  4. Prediabetes Check HgbA1c with labs.  - Hemoglobin A1c  5. Vitamin D deficiency Check vitamin d level and treat as indicated  - VITAMIN D 25 Hydroxy (Vit-D Deficiency, Fractures)  6. Need for hepatitis C screening test Screen for hep c today.  -  Hepatitis C Antibody  7. Need for Tdap vaccination TDAP vaccine administered in the office today.  - Tdap vaccine greater than or equal to 7yo IM  Problem List Items Addressed This Visit      Cardiovascular and Mediastinum   HTN, goal below 130/80   Relevant Orders   CBC   Comprehensive metabolic panel     Other   Hyperlipidemia   Relevant Orders   Lipid panel   Prediabetes   Relevant Orders   Hemoglobin A1c   Healthcare maintenance - Primary   Relevant Orders   Hepatitis C Antibody   CBC   Comprehensive metabolic panel   TSH   Lipid panel   Hemoglobin A1c   VITAMIN D 25 Hydroxy (Vit-D Deficiency, Fractures)   Vitamin D deficiency   Relevant Orders   VITAMIN D 25 Hydroxy (Vit-D Deficiency, Fractures)   Need for hepatitis C screening test   Relevant Orders   Hepatitis C Antibody   Need for Tdap vaccination   Relevant Orders   Tdap vaccine greater than or equal to 7yo IM (Completed)     Time spent with the patient was approximately 45 minutes. This time included reviewing progress notes, labs, imaging studies, and discussing plan for follow up.   Follow-up: Return in about 6 months (around 09/03/2021) for htn.    Ronnell Freshwater, NP

## 2021-03-05 LAB — CBC
Hematocrit: 40.8 % (ref 37.5–51.0)
Hemoglobin: 13.8 g/dL (ref 13.0–17.7)
MCH: 29.7 pg (ref 26.6–33.0)
MCHC: 33.8 g/dL (ref 31.5–35.7)
MCV: 88 fL (ref 79–97)
Platelets: 201 10*3/uL (ref 150–450)
RBC: 4.64 x10E6/uL (ref 4.14–5.80)
RDW: 13.3 % (ref 11.6–15.4)
WBC: 3.1 10*3/uL — ABNORMAL LOW (ref 3.4–10.8)

## 2021-03-05 LAB — COMPREHENSIVE METABOLIC PANEL
ALT: 27 IU/L (ref 0–44)
AST: 30 IU/L (ref 0–40)
Albumin/Globulin Ratio: 1.4 (ref 1.2–2.2)
Albumin: 4.1 g/dL (ref 3.8–4.9)
Alkaline Phosphatase: 53 IU/L (ref 44–121)
BUN/Creatinine Ratio: 13 (ref 9–20)
BUN: 15 mg/dL (ref 6–24)
Bilirubin Total: 0.6 mg/dL (ref 0.0–1.2)
CO2: 22 mmol/L (ref 20–29)
Calcium: 8.7 mg/dL (ref 8.7–10.2)
Chloride: 100 mmol/L (ref 96–106)
Creatinine, Ser: 1.13 mg/dL (ref 0.76–1.27)
Globulin, Total: 2.9 g/dL (ref 1.5–4.5)
Glucose: 87 mg/dL (ref 65–99)
Potassium: 4.3 mmol/L (ref 3.5–5.2)
Sodium: 139 mmol/L (ref 134–144)
Total Protein: 7 g/dL (ref 6.0–8.5)
eGFR: 76 mL/min/{1.73_m2} (ref >59)

## 2021-03-05 LAB — LIPID PANEL
Chol/HDL Ratio: 3.8 ratio (ref 0.0–5.0)
Cholesterol, Total: 195 mg/dL (ref 100–199)
HDL: 52 mg/dL (ref >39)
LDL Chol Calc (NIH): 126 mg/dL — ABNORMAL HIGH (ref 0–99)
Triglycerides: 95 mg/dL (ref 0–149)
VLDL Cholesterol Cal: 17 mg/dL (ref 5–40)

## 2021-03-05 LAB — HEMOGLOBIN A1C
Est. average glucose Bld gHb Est-mCnc: 128 mg/dL
Hgb A1c MFr Bld: 6.1 % — ABNORMAL HIGH (ref 4.8–5.6)

## 2021-03-05 LAB — VITAMIN D 25 HYDROXY (VIT D DEFICIENCY, FRACTURES): Vit D, 25-Hydroxy: 44.9 ng/mL (ref 30.0–100.0)

## 2021-03-05 LAB — HEPATITIS C ANTIBODY: Hep C Virus Ab: <0.1 s/co ratio (ref 0.0–0.9)

## 2021-03-05 LAB — TSH: TSH: 0.918 u[IU]/mL (ref 0.450–4.500)

## 2021-03-14 ENCOUNTER — Encounter: Payer: Self-pay | Admitting: Nurse Practitioner

## 2021-03-14 NOTE — Progress Notes (Signed)
HgbA1c starting to creep up. Will monitor closely. Message sent to patient via mychart .

## 2021-07-12 ENCOUNTER — Other Ambulatory Visit: Payer: Self-pay | Admitting: Physician Assistant

## 2021-07-12 DIAGNOSIS — I10 Essential (primary) hypertension: Secondary | ICD-10-CM

## 2021-08-04 ENCOUNTER — Other Ambulatory Visit: Payer: Self-pay | Admitting: Physician Assistant

## 2021-08-04 DIAGNOSIS — I10 Essential (primary) hypertension: Secondary | ICD-10-CM

## 2021-08-28 ENCOUNTER — Other Ambulatory Visit: Payer: Self-pay | Admitting: Physician Assistant

## 2021-08-28 DIAGNOSIS — I10 Essential (primary) hypertension: Secondary | ICD-10-CM

## 2021-09-03 ENCOUNTER — Ambulatory Visit: Payer: No Typology Code available for payment source | Admitting: Physician Assistant

## 2021-09-24 ENCOUNTER — Other Ambulatory Visit: Payer: Self-pay | Admitting: Physician Assistant

## 2021-09-24 DIAGNOSIS — I10 Essential (primary) hypertension: Secondary | ICD-10-CM

## 2021-10-01 ENCOUNTER — Other Ambulatory Visit: Payer: Self-pay | Admitting: Physician Assistant

## 2021-10-01 DIAGNOSIS — I10 Essential (primary) hypertension: Secondary | ICD-10-CM

## 2021-11-06 ENCOUNTER — Other Ambulatory Visit: Payer: Self-pay | Admitting: Physician Assistant

## 2021-11-06 DIAGNOSIS — I10 Essential (primary) hypertension: Secondary | ICD-10-CM

## 2021-11-19 ENCOUNTER — Ambulatory Visit (INDEPENDENT_AMBULATORY_CARE_PROVIDER_SITE_OTHER): Payer: No Typology Code available for payment source | Admitting: Nurse Practitioner

## 2021-11-19 ENCOUNTER — Other Ambulatory Visit: Payer: Self-pay

## 2021-11-19 ENCOUNTER — Encounter: Payer: Self-pay | Admitting: Nurse Practitioner

## 2021-11-19 VITALS — Temp 97.9°F | Ht 69.0 in | Wt 200.0 lb

## 2021-11-19 DIAGNOSIS — U071 COVID-19: Secondary | ICD-10-CM

## 2021-11-19 DIAGNOSIS — J988 Other specified respiratory disorders: Secondary | ICD-10-CM | POA: Diagnosis not present

## 2021-11-19 MED ORDER — NIRMATRELVIR/RITONAVIR (PAXLOVID)TABLET: 3.0000 | ORAL_TABLET | Freq: Two times a day (BID) | ORAL | 0 refills | Status: AC

## 2021-11-19 NOTE — Progress Notes (Signed)
Virtual Visit via Telephone Note  I connected with Jay Arnold on 11/19/21 at 10:20 AM EST by telephone and verified that I am speaking with the correct person using two identifiers.  Location: Patient: home Provider: home office   I discussed the limitations, risks, security and privacy concerns of performing an evaluation and management service by telephone and the availability of in person appointments. I also discussed with the patient that there may be a patient responsible charge related to this service. The patient expressed understanding and agreed to proceed.   History of Present Illness: The patient presents for acute visit. He states that on Wednesday, he started having symptoms of headache, body aches, chills, and fever. He states that Thursday, he spent the entire day in bed, feeling very poorly. He states that thursday night, he took a home test for COVID 19 and it was positive. He states that today, he continues to have body aches and chill. He also has headache. He did have fever this morning. He took dose of theraflu and symptoms seem to be a little better. He denies nausea or vomiting. Does have significant fatigue. The patient states that he has not have COVID 19 in the past and he states that he is vaccinated.    Observations/Objective: The patient is alert and oriented. He is pleasant and answering all questions appropriately. Breathing is non-labored. He is in no acute distress.  He is nasally congested.   Assessment and Plan: 1. Respiratory tract infection due to COVID-19 virus Patient with home test for COVID 19 poitive yesterday, with symptoms having started the day before. Will start paxlovid, taking 3 tablets twice daily for next 5 days. Advised him to rest and increase fluids. Continue using OTC medication to control symptoms.  Recommended he add vitamins C, D, and zinc to boost immune system. Reviewed isolation and quarantine procedures for COVID 19. He should  stayed quarantined until Sunday. After that, he should wear a mask in public for additional five days. The patient voiced understanding and agreement.  - nirmatrelvir/ritonavir EUA (PAXLOVID) 20 x 150 MG & 10 x 100MG  TABS; Take 3 tablets by mouth 2 (two) times daily for 5 days. (Take nirmatrelvir 150 mg two tablets twice daily for 5 days and ritonavir 100 mg one tablet twice daily for 5 days) Patient GFR is 76  Dispense: 30 tablet; Refill: 0   Follow Up Instructions:    I discussed the assessment and treatment plan with the patient. The patient was provided an opportunity to ask questions and all were answered. The patient agreed with the plan and demonstrated an understanding of the instructions.   The patient was advised to call back or seek an in-person evaluation if the symptoms worsen or if the condition fails to improve as anticipated.  I provided 10 minutes of non-face-to-face time during this encounter.   Ronnell Freshwater, NP

## 2021-12-11 ENCOUNTER — Other Ambulatory Visit: Payer: Self-pay | Admitting: Nurse Practitioner

## 2021-12-11 DIAGNOSIS — I10 Essential (primary) hypertension: Secondary | ICD-10-CM

## 2022-05-06 ENCOUNTER — Encounter: Payer: Self-pay | Admitting: Gastroenterology

## 2023-02-08 ENCOUNTER — Encounter: Payer: Self-pay | Admitting: Gastroenterology

## 2023-02-20 ENCOUNTER — Ambulatory Visit (AMBULATORY_SURGERY_CENTER): Payer: No Typology Code available for payment source

## 2023-02-20 ENCOUNTER — Encounter: Payer: Self-pay | Admitting: Gastroenterology

## 2023-02-20 VITALS — Ht 69.0 in | Wt 200.0 lb

## 2023-02-20 DIAGNOSIS — Z86010 Personal history of colonic polyps: Secondary | ICD-10-CM

## 2023-02-20 MED ORDER — NA SULFATE-K SULFATE-MG SULF 17.5-3.13-1.6 GM/177ML PO SOLN: 1.0000 | Freq: Once | ORAL | 0 refills | Status: AC

## 2023-02-20 NOTE — Progress Notes (Signed)
No egg or soy allergy known to patient   No issues known to pt with past sedation with any surgeries or procedures  Patient denies ever being told they had issues or difficulty with intubation   No FH of Malignant Hyperthermia  Pt is not on diet pills  Pt is not on  home 02   Pt is not on blood thinners   Pt denies issues with constipation   No A fib or A flutter  Have any cardiac testing pending--no  Pt instructed to use Singlecare.com or GoodRx for a price reduction on prep    Virtual previsit 

## 2023-03-22 ENCOUNTER — Encounter: Payer: Self-pay | Admitting: Certified Registered Nurse Anesthetist

## 2023-03-23 ENCOUNTER — Encounter: Payer: Self-pay | Admitting: Gastroenterology

## 2023-03-23 ENCOUNTER — Ambulatory Visit (AMBULATORY_SURGERY_CENTER): Payer: No Typology Code available for payment source | Admitting: Gastroenterology

## 2023-03-23 VITALS — BP 155/94 | HR 46 | Temp 98.0°F | Resp 15 | Ht 69.0 in | Wt 200.0 lb

## 2023-03-23 DIAGNOSIS — Z09 Encounter for follow-up examination after completed treatment for conditions other than malignant neoplasm: Secondary | ICD-10-CM | POA: Diagnosis not present

## 2023-03-23 DIAGNOSIS — K635 Polyp of colon: Secondary | ICD-10-CM

## 2023-03-23 DIAGNOSIS — D123 Benign neoplasm of transverse colon: Secondary | ICD-10-CM

## 2023-03-23 DIAGNOSIS — Z86010 Personal history of colonic polyps: Secondary | ICD-10-CM

## 2023-03-23 MED ORDER — SODIUM CHLORIDE 0.9 % IV SOLN: 500.0000 mL | Freq: Once | INTRAVENOUS | Status: DC

## 2023-03-23 NOTE — Progress Notes (Signed)
Pt's states no medical or surgical changes since previsit or office visit.   Pt states he doesn't have HTN only when he is at the Dr's office, pt states he monitors his bp at home, instruct pt to follow more closely and go back to his bp medications if necessary, verb understanding, let Dr Russella Dar and CRNA know of pt's bp readings this AM

## 2023-03-23 NOTE — Progress Notes (Signed)
Report given to PACU, vss 

## 2023-03-23 NOTE — Progress Notes (Signed)
838 Pt extremely anxious and shaking in fear of procedure.

## 2023-03-23 NOTE — Progress Notes (Signed)
History & Physical  Primary Care Physician:  Carlean Jews, NP Primary Gastroenterologist: Claudette Head, MD  Impression / Plan:  Personal history of adenomatous colon polyps for surveillance colonoscopy.  CHIEF COMPLAINT:  Personal history of colon polyps   HPI: Jay Arnold is a 62 y.o. male with a personal history of adenomatous colon polyps for surveillance colonoscopy.   Past Medical History:  Diagnosis Date   Hypertension    02/20/23-pt denies currently having HTN   Leukocytopenia    Leukopenia 11/12/2015    Past Surgical History:  Procedure Laterality Date   APPENDECTOMY  11/28/2008   COLONOSCOPY  2018   Left neck cyst removal  11/28/2008   VASECTOMY  11/28/2005    Prior to Admission medications   Medication Sig Start Date End Date Taking? Authorizing Provider  Cholecalciferol (DIALYVITE VITAMIN D 5000) 125 MCG (5000 UT) capsule 1 tablet by mouth daily. Patient not taking: Reported on 03/23/2023 12/23/19   Thomasene Lot, DO  Olmesartan-amLODIPine-HCTZ 20-5-12.5 MG TABS Take 0.5 tablets by mouth daily. **NEEDS APT FOR REFILLS** Patient not taking: Reported on 02/20/2023 11/08/21   Carlean Jews, NP    Current Outpatient Medications  Medication Sig Dispense Refill   Cholecalciferol (DIALYVITE VITAMIN D 5000) 125 MCG (5000 UT) capsule 1 tablet by mouth daily. (Patient not taking: Reported on 03/23/2023)     Olmesartan-amLODIPine-HCTZ 20-5-12.5 MG TABS Take 0.5 tablets by mouth daily. **NEEDS APT FOR REFILLS** (Patient not taking: Reported on 02/20/2023) 15 tablet 0   Current Facility-Administered Medications  Medication Dose Route Frequency Provider Last Rate Last Admin   0.9 %  sodium chloride infusion  500 mL Intravenous Once Meryl Dare, MD        Allergies as of 03/23/2023   (No Known Allergies)    Family History  Problem Relation Age of Onset   Diabetes Father    Diabetes Sister    Prostate cancer Maternal Uncle        x 2 uncles    Heart disease Maternal Grandmother    Breast cancer Paternal Grandmother    Heart disease Paternal Grandfather    Colon cancer Neg Hx    Stomach cancer Neg Hx    Colon polyps Neg Hx    Esophageal cancer Neg Hx    Rectal cancer Neg Hx     Social History   Socioeconomic History   Marital status: Married    Spouse name: Not on file   Number of children: 2   Years of education: Not on file   Highest education level: Not on file  Occupational History   Occupation: Hospital doctor    Employer: [employer]  Tobacco Use   Smoking status: Never   Smokeless tobacco: Never  Vaping Use   Vaping Use: Never used  Substance and Sexual Activity   Alcohol use: Yes    Comment: couple times per year   Drug use: Never   Sexual activity: Yes  Other Topics Concern   Not on file  Social History Narrative   Not on file   Social Determinants of Health   Financial Resource Strain: Not on file  Food Insecurity: Not on file  Transportation Needs: Not on file  Physical Activity: Not on file  Stress: Not on file  Social Connections: Not on file  Intimate Partner Violence: Not on file    Review of Systems:  All systems reviewed were negative except where noted in HPI.   Physical Exam: General:  Alert, well-developed, in  NAD Head:  Normocephalic and atraumatic. Eyes:  Sclera clear, no icterus.   Conjunctiva pink. Ears:  Normal auditory acuity. Mouth:  No deformity or lesions.  Neck:  Supple; no masses. Lungs:  Clear throughout to auscultation.   No wheezes, crackles, or rhonchi.  Heart:  Regular rate and rhythm; no murmurs. Abdomen:  Soft, nondistended, nontender. No masses, hepatomegaly. No palpable masses.  Normal bowel sounds.    Rectal:  Deferred   Msk:  Symmetrical without gross deformities. Extremities:  Without edema. Neurologic:  Alert and  oriented x 4; grossly normal neurologically. Skin:  Intact without significant lesions or rashes. Psych:  Alert and cooperative. Normal mood  and affect.   Venita Lick. Russella Dar  03/23/2023, 8:32 AM See Loretha Stapler, [facility], to contact our on call provider

## 2023-03-23 NOTE — Progress Notes (Signed)
Called to room to assist during endoscopic procedure.  Patient ID and intended procedure confirmed with present staff. Received instructions for my participation in the procedure from the performing physician.  

## 2023-03-23 NOTE — Op Note (Signed)
[facility] Patient Name: Jay Arnold Procedure Date: 03/23/2023 8:29 AM MRN: [id number] Endoscopist: Meryl Dare , MD, [phone] Age: 62 Referring MD:  Date of Birth: May 28, 1961 Gender: Male Account #: [id number] Procedure:                Colonoscopy Indications:              Surveillance: Personal history of adenomatous                            polyps on last colonoscopy > 5 years ago Medicines:                Monitored Anesthesia Care Procedure:                Pre-Anesthesia Assessment:                           - Prior to the procedure, a History and Physical                            was performed, and patient medications and                            allergies were reviewed. The patient's tolerance of                            previous anesthesia was also reviewed. The risks                            and benefits of the procedure and the sedation                            options and risks were discussed with the patient.                            All questions were answered, and informed consent                            was obtained. Prior Anticoagulants: The patient has                            taken no anticoagulant or antiplatelet agents. ASA                            Grade Assessment: II - A patient with mild systemic                            disease. After reviewing the risks and benefits,                            the patient was deemed in satisfactory condition to                            undergo the procedure.  After obtaining informed consent, the colonoscope                            was passed under direct vision. Throughout the                            procedure, the patient's blood pressure, pulse, and                            oxygen saturations were monitored continuously. The                            CF [id number] #[id number] was introduced through the anus                            and advanced to  the the cecum, identified by                            appendiceal orifice and ileocecal valve. The                            ileocecal valve, appendiceal orifice, and rectum                            were photographed. The quality of the bowel                            preparation was good. The colonoscopy was performed                            without difficulty. The patient tolerated the                            procedure well. Scope In: 8:39:27 AM Scope Out: 8:58:07 AM Scope Withdrawal Time: 0 hours 16 minutes 16 seconds  Total Procedure Duration: 0 hours 18 minutes 40 seconds  Findings:                 The perianal and digital rectal examinations were                            normal.                           Two sessile polyps were found in the transverse                            colon. The polyps were 7 mm in size. These polyps                            were removed with a cold snare. Resection and                            retrieval were complete.  Three sessile polyps were found in the transverse                            colon. The polyps were 3 to 4 mm in size. These                            polyps were removed with a cold biopsy forceps.                            Resection and retrieval were complete.                           External and internal hemorrhoids were found during                            retroflexion. The hemorrhoids were medium-sized and                            Grade I (internal hemorrhoids that do not prolapse).                           The exam was otherwise without abnormality on                            direct and retroflexion views. Complications:            No immediate complications. Estimated blood loss:                            None. Estimated Blood Loss:     Estimated blood loss: none. Impression:               - Two 7 mm polyps in the transverse colon, removed                            with a  cold snare. Resected and retrieved.                           - Three 3 to 4 mm polyps in the transverse colon,                            removed with a cold biopsy forceps. Resected and                            retrieved.                           - External and internal hemorrhoids.                           - The examination was otherwise normal on direct                            and retroflexion views. Recommendation:           -  Repeat colonoscopy after studies are complete for                            surveillance based on pathology results.                           - Patient has a contact number available for                            emergencies. The signs and symptoms of potential                            delayed complications were discussed with the                            patient. Return to normal activities tomorrow.                            Written discharge instructions were provided to the                            patient.                           - Resume previous diet.                           - Continue present medications.                           - Await pathology results. Meryl Dare, MD 03/23/2023 9:00:56 AM This report has been signed electronically.

## 2023-03-23 NOTE — Patient Instructions (Addendum)
Resume previous diet. Continue present medications. Await pathology results.   YOU HAD AN ENDOSCOPIC PROCEDURE TODAY AT THE [facility]:   Refer to the procedure report that was given to you for any specific questions about what was found during the examination.  If the procedure report does not answer your questions, please call your gastroenterologist to clarify.  If you requested that your care partner not be given the details of your procedure findings, then the procedure report has been included in a sealed envelope for you to review at your convenience later.  YOU SHOULD EXPECT: Some feelings of bloating in the abdomen. Passage of more gas than usual.  Walking can help get rid of the air that was put into your GI tract during the procedure and reduce the bloating. If you had a lower endoscopy (such as a colonoscopy or flexible sigmoidoscopy) you may notice spotting of blood in your stool or on the toilet paper. If you underwent a bowel prep for your procedure, you may not have a normal bowel movement for a few days.  Please Note:  You might notice some irritation and congestion in your nose or some drainage.  This is from the oxygen used during your procedure.  There is no need for concern and it should clear up in a day or so.  SYMPTOMS TO REPORT IMMEDIATELY:  Following lower endoscopy (colonoscopy or flexible sigmoidoscopy):  Excessive amounts of blood in the stool  Significant tenderness or worsening of abdominal pains  Swelling of the abdomen that is new, acute  Fever of 100F or higher   For urgent or emergent issues, a gastroenterologist can be reached at any hour by calling [phone]. Do not use MyChart messaging for urgent concerns.    DIET:  We do recommend a small meal at first, but then you may proceed to your regular diet.  Drink plenty of fluids but you should avoid alcoholic beverages for 24 hours.  ACTIVITY:  You should plan to take it easy for the  rest of today and you should NOT DRIVE or use heavy machinery until tomorrow (because of the sedation medicines used during the test).    FOLLOW UP: Our staff will call the number listed on your records the next business day following your procedure.  We will call around 7:15- 8:00 am to check on you and address any questions or concerns that you may have regarding the information given to you following your procedure. If we do not reach you, we will leave a message.     If any biopsies were taken you will be contacted by phone or by letter within the next 1-3 weeks.  Please call us at [phone] if you have not heard about the biopsies in 3 weeks.    SIGNATURES/CONFIDENTIALITY: You and/or your care partner have signed paperwork which will be entered into your electronic medical record.  These signatures attest to the fact that that the information above on your After Visit Summary has been reviewed and is understood.  Full responsibility of the confidentiality of this discharge information lies with you and/or your care-partner. 

## 2023-03-24 ENCOUNTER — Telehealth: Payer: Self-pay | Admitting: *Deleted

## 2023-03-24 NOTE — Telephone Encounter (Signed)
Follow up call attempt.  LVM to call if any questions or concerns. 

## 2023-03-29 ENCOUNTER — Other Ambulatory Visit: Payer: Self-pay

## 2023-03-29 DIAGNOSIS — Z0000 Encounter for general adult medical examination without abnormal findings: Secondary | ICD-10-CM

## 2023-03-29 DIAGNOSIS — Z130 Encounter for screening for diseases of the blood and blood-forming organs and certain disorders involving the immune mechanism: Secondary | ICD-10-CM

## 2023-04-03 ENCOUNTER — Encounter: Payer: Self-pay | Admitting: Gastroenterology

## 2023-04-07 ENCOUNTER — Other Ambulatory Visit: Payer: No Typology Code available for payment source

## 2023-04-07 DIAGNOSIS — Z0000 Encounter for general adult medical examination without abnormal findings: Secondary | ICD-10-CM

## 2023-04-07 DIAGNOSIS — Z1329 Encounter for screening for other suspected endocrine disorder: Secondary | ICD-10-CM

## 2023-04-08 LAB — COMPREHENSIVE METABOLIC PANEL
ALT: 21 IU/L (ref 0–44)
AST: 25 IU/L (ref 0–40)
Albumin/Globulin Ratio: 1.6 (ref 1.2–2.2)
Albumin: 4.2 g/dL (ref 3.8–4.9)
Alkaline Phosphatase: 54 IU/L (ref 44–121)
BUN/Creatinine Ratio: 13 (ref 9–20)
BUN: 16 mg/dL (ref 6–24)
Bilirubin Total: 0.6 mg/dL (ref 0.0–1.2)
CO2: 22 mmol/L (ref 20–29)
Calcium: 9.2 mg/dL (ref 8.7–10.2)
Chloride: 105 mmol/L (ref 96–106)
Creatinine, Ser: 1.23 mg/dL (ref 0.76–1.27)
Globulin, Total: 2.7 g/dL (ref 1.5–4.5)
Glucose: 91 mg/dL (ref 70–99)
Potassium: 4.6 mmol/L (ref 3.5–5.2)
Sodium: 141 mmol/L (ref 134–144)
Total Protein: 6.9 g/dL (ref 6.0–8.5)
eGFR: 68 mL/min/{1.73_m2} (ref >59)

## 2023-04-08 LAB — LIPID PANEL
Chol/HDL Ratio: 3.4 ratio (ref 0.0–5.0)
Cholesterol, Total: 197 mg/dL (ref 100–199)
HDL: 58 mg/dL (ref >39)
LDL Chol Calc (NIH): 128 mg/dL — ABNORMAL HIGH (ref 0–99)
Triglycerides: 62 mg/dL (ref 0–149)
VLDL Cholesterol Cal: 11 mg/dL (ref 5–40)

## 2023-04-08 LAB — CBC
Hematocrit: 42.1 % (ref 37.5–51.0)
Hemoglobin: 14.4 g/dL (ref 13.0–17.7)
MCH: 30.7 pg (ref 26.6–33.0)
MCHC: 34.2 g/dL (ref 31.5–35.7)
MCV: 90 fL (ref 79–97)
Platelets: 181 10*3/uL (ref 150–450)
RBC: 4.69 x10E6/uL (ref 4.14–5.80)
RDW: 13.8 % (ref 11.6–15.4)
WBC: 2.8 10*3/uL — ABNORMAL LOW (ref 3.4–10.8)

## 2023-04-08 LAB — HEMOGLOBIN A1C
Est. average glucose Bld gHb Est-mCnc: 120 mg/dL
Hgb A1c MFr Bld: 5.8 % — ABNORMAL HIGH (ref 4.8–5.6)

## 2023-04-08 LAB — TSH: TSH: 1.07 u[IU]/mL (ref 0.450–4.500)

## 2023-04-10 NOTE — Progress Notes (Signed)
WBC continues to be reduced. LDL mildly elevated at 128. Impaired fasting glucose. CPE 04/21/2023 with Dr. Corky Downs.

## 2023-04-14 ENCOUNTER — Encounter: Payer: No Typology Code available for payment source | Admitting: Nurse Practitioner

## 2023-04-21 ENCOUNTER — Encounter: Payer: Self-pay | Admitting: Family Medicine

## 2023-04-21 ENCOUNTER — Ambulatory Visit (INDEPENDENT_AMBULATORY_CARE_PROVIDER_SITE_OTHER): Payer: No Typology Code available for payment source | Admitting: Family Medicine

## 2023-04-21 VITALS — BP 152/96 | HR 71 | Temp 98.6°F | Ht 69.0 in | Wt 190.0 lb

## 2023-04-21 DIAGNOSIS — E785 Hyperlipidemia, unspecified: Secondary | ICD-10-CM | POA: Diagnosis not present

## 2023-04-21 DIAGNOSIS — I10 Essential (primary) hypertension: Secondary | ICD-10-CM | POA: Diagnosis not present

## 2023-04-21 MED ORDER — ATORVASTATIN CALCIUM 10 MG PO TABS: 10.0000 mg | ORAL_TABLET | Freq: Every day | ORAL | 3 refills | Status: DC

## 2023-04-21 MED ORDER — AMLODIPINE BESYLATE 5 MG PO TABS
5.0000 mg | ORAL_TABLET | Freq: Every day | ORAL | 1 refills | Status: DC
Start: 2023-04-21 — End: 2023-10-24

## 2023-04-21 NOTE — Patient Instructions (Signed)
It was nice to meet you today,  We have started amlodipine for your blood pressure and we have started Lipitor for your cholesterol levels.  These are both meant to reduce your risk of heart attack, stroke, kidney disease.  I would like you to follow-up in 6 to 8 weeks and we can recheck your cholesterol level and your blood pressure.  Have a great day,  Frederic Jericho, MD

## 2023-04-21 NOTE — Assessment & Plan Note (Signed)
Elevated. Already exercises and at healthy weight.  Started amlodipine.  Recheck at next visit.

## 2023-04-21 NOTE — Progress Notes (Unsigned)
Complete physical exam  Patient: Jay Arnold   DOB: [birth year]   62 y.o. Male  MRN: [id number]  Subjective:    Chief Complaint  Patient presents with   Annual Exam    Jay Arnold is a 62 y.o. male who presents today for a complete physical exam. He reports consuming a general diet. Gym/ health club routine includes cardio and mod to heavy weightlifting. He generally feels well. He reports sleeping well. He does have additional problems to discuss today.  Patient works as a Immunologist.  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  We discussed his cholesterol levels and indications for starting a statin medication.  We discussed the side effects of this.  He is willing to start taking a statin medication.  He states that his blood pressure has been more elevated recently, noting it was worse after a colonoscopy.  Patient exercises and eats healthy.  We discussed restarting blood pressure medication.  Discussed starting amlodipine.  Discussed side effects.  Patient okay with this.   The 10-year ASCVD risk score (Arnett DK, et al., 2019) is: 16.7%  The 10-year ASCVD risk score (Arnett DK, et al., 2019) is: 16.7%    Most recent fall risk assessment:    04/21/2023    8:22 AM  Fall Risk   Falls in the past year? 0  Number falls in past yr: 0  Injury with Fall? 0  Risk for fall due to : No Fall Risks  Follow up Falls evaluation completed     Most recent depression screenings:    04/21/2023    8:22 AM 11/19/2021   10:13 AM  PHQ 2/9 Scores  PHQ - 2 Score 0 0  PHQ- 9 Score 2 2      Patient Care Team: Carlean Jews, NP as PCP - General (Family Medicine) Artis Delay, MD as Consulting Physician (Hematology and Oncology) Meryl Dare, MD as Consulting Physician (Gastroenterology) Exie Parody, MD as Consulting Physician (Oncology)   Outpatient Medications Prior to Visit  Medication Sig   [DISCONTINUED] Cholecalciferol  (DIALYVITE VITAMIN D 5000) 125 MCG (5000 UT) capsule 1 tablet by mouth daily. (Patient not taking: Reported on 03/23/2023)   [DISCONTINUED] Olmesartan-amLODIPine-HCTZ 20-5-12.5 MG TABS Take 0.5 tablets by mouth daily. **NEEDS APT FOR REFILLS** (Patient not taking: Reported on 02/20/2023)   No facility-administered medications prior to visit.    ROS        Objective:     BP (!) 152/96   Pulse 71   Temp 98.6 F (37 C) (Oral)   Ht 5\' 9"  (1.753 m)   Wt 190 lb 0.1 oz (86.2 kg)   SpO2 98%   BMI 28.06 kg/m  {Vitals History (Optional):23777}  Physical Exam General: Alert and oriented HEENT: PERRLA, EOMI, moist oral mucosa CV: Regular rate and rhythm no murmurs Pulmonary: Clear bilaterally no wheezes or crackles GI: Soft, nontender MSK: Normal strength equal bilaterally Neuro: Cranial nerves II through XII grossly intact Psych pleasant affect.  No results found for any visits on 04/21/23. {Show previous labs (optional):23779}    Assessment & Plan:    Routine Health Maintenance and Physical Exam  Immunization History  Administered Date(s) Administered   Influenza Split 11/14/2011   Influenza,inj,Quad PF,6+ Mos 11/16/2015   Tdap 03/04/2021    Health Maintenance  Topic Date Due   COVID-19 Vaccine (1) Never done   Zoster Vaccines- Shingrix (1 of 2) Never done   INFLUENZA VACCINE  06/29/2023   Colonoscopy  03/22/2026   DTaP/Tdap/Td (2 - Td or Tdap) 03/05/2031   Hepatitis C Screening  Completed   HIV Screening  Completed   HPV VACCINES  Aged Out    Discussed health benefits of physical activity, and encouraged him to engage in regular exercise appropriate for his age and condition.  Problem List Items Addressed This Visit       Cardiovascular and Mediastinum   HTN, goal below 130/80    Elevated. Already exercises and at healthy weight.  Started amlodipine.  Recheck at next visit.       Relevant Medications   amLODipine (NORVASC) 5 MG tablet   atorvastatin  (LIPITOR) 10 MG tablet     Other   Hyperlipidemia - Primary    Ascvd risk is elevated. Discussed starting statin with pt.  Will start lipitor and recheck in 6 weeks.        Relevant Medications   amLODipine (NORVASC) 5 MG tablet   atorvastatin (LIPITOR) 10 MG tablet   Other Relevant Orders   Lipid Profile   Return in about 6 weeks (around 06/02/2023) for HTN,hld.     Sandre Kitty, MD

## 2023-04-21 NOTE — Assessment & Plan Note (Signed)
Ascvd risk is elevated. Discussed starting statin with pt.  Will start lipitor and recheck in 6 weeks.

## 2023-06-21 ENCOUNTER — Encounter: Payer: Self-pay | Admitting: Family Medicine

## 2023-06-21 ENCOUNTER — Telehealth (INDEPENDENT_AMBULATORY_CARE_PROVIDER_SITE_OTHER): Payer: No Typology Code available for payment source | Admitting: Family Medicine

## 2023-06-21 ENCOUNTER — Ambulatory Visit: Payer: No Typology Code available for payment source | Admitting: Family Medicine

## 2023-06-21 VITALS — BP 131/87 | HR 78 | Ht 69.0 in | Wt 190.0 lb

## 2023-06-21 DIAGNOSIS — E782 Mixed hyperlipidemia: Secondary | ICD-10-CM

## 2023-06-21 DIAGNOSIS — I10 Essential (primary) hypertension: Secondary | ICD-10-CM | POA: Diagnosis not present

## 2023-06-21 NOTE — Progress Notes (Signed)
   Established Patient Office Visit  Subjective   Patient ID: Jay Arnold, male    DOB: Apr 27, 1961  Age: 62 y.o. MRN: [id number]  Chief Complaint  Patient presents with   Medical Management of Chronic Issues    HPI  Patient seen via video conference.  Patient located at home.  I am located at primary care office.  Patient has been compliant with his amlodipine and his cholesterol medication.  No leg swelling.  Did feel like his thigh muscles had some pain initially but no pain now.  Feels like it may have been just because he was reading about side effects.  Doing a video visit today because his daughter recently tested positive for COVID and did not want to spread it.   The 10-year ASCVD risk score (Arnett DK, et al., 2019) is: 13.3%     ROS    Objective:     BP 131/87   Pulse 78   Ht 5\' 9"  (1.753 m)   Wt 190 lb (86.2 kg)   BMI 28.06 kg/m    Physical Exam General: Alert and oriented Pulm: No respiratory distress   No results found for any visits on 06/21/23.        Assessment & Plan:   HTN, goal below 130/80 Assessment & Plan: Patient reports blood pressures being consistently in the 120s over 70s.  No leg swelling. Continue amlodipine   Mixed hyperlipidemia Assessment & Plan: Tolerating statin. Lab visit in the next week for cholesterol 42-month follow-up in office      No follow-ups on file.    Sandre Kitty, MD

## 2023-06-21 NOTE — Assessment & Plan Note (Signed)
Patient reports blood pressures being consistently in the 120s over 70s.  No leg swelling. Continue amlodipine

## 2023-06-21 NOTE — Assessment & Plan Note (Signed)
Tolerating statin. Lab visit in the next week for cholesterol 51-month follow-up in office

## 2023-08-15 ENCOUNTER — Other Ambulatory Visit: Payer: No Typology Code available for payment source

## 2023-08-15 DIAGNOSIS — E785 Hyperlipidemia, unspecified: Secondary | ICD-10-CM

## 2023-10-24 ENCOUNTER — Other Ambulatory Visit: Payer: Self-pay | Admitting: Family Medicine

## 2024-04-19 ENCOUNTER — Other Ambulatory Visit: Payer: Self-pay | Admitting: Family Medicine

## 2024-07-19 ENCOUNTER — Other Ambulatory Visit: Payer: Self-pay | Admitting: Family Medicine

## 2024-07-19 ENCOUNTER — Encounter: Payer: Self-pay | Admitting: Family Medicine

## 2024-07-19 ENCOUNTER — Ambulatory Visit (INDEPENDENT_AMBULATORY_CARE_PROVIDER_SITE_OTHER): Admitting: Family Medicine

## 2024-07-19 VITALS — BP 130/89 | HR 69 | Ht 69.0 in | Wt 195.8 lb

## 2024-07-19 DIAGNOSIS — D72819 Decreased white blood cell count, unspecified: Secondary | ICD-10-CM | POA: Diagnosis not present

## 2024-07-19 DIAGNOSIS — Z0000 Encounter for general adult medical examination without abnormal findings: Secondary | ICD-10-CM

## 2024-07-19 DIAGNOSIS — E782 Mixed hyperlipidemia: Secondary | ICD-10-CM

## 2024-07-19 DIAGNOSIS — R7303 Prediabetes: Secondary | ICD-10-CM

## 2024-07-19 DIAGNOSIS — I10 Essential (primary) hypertension: Secondary | ICD-10-CM | POA: Diagnosis not present

## 2024-07-19 DIAGNOSIS — Z125 Encounter for screening for malignant neoplasm of prostate: Secondary | ICD-10-CM

## 2024-07-19 MED ORDER — AMLODIPINE BESYLATE 10 MG PO TABS: 10.0000 mg | ORAL_TABLET | Freq: Every day | ORAL | 3 refills | Status: AC

## 2024-07-19 NOTE — Assessment & Plan Note (Signed)
 Managed with atorvastatin . Started due to elevated cholesterol and 10-year ASCVD risk >10%. Last LDL was 60. Strong family history of cardiovascular disease. - Continue atorvastatin . - Counseled on dietary modifications including reducing saturated fat intake (beef, pork, dairy, processed foods) to <5g/day. - Discussed non-pharmacologic options to lower cholesterol: fiber (Metamucil/psyllium husk), plant sterols (Smart Balance), and CoQ10.

## 2024-07-19 NOTE — Progress Notes (Signed)
 Annual physical  Subjective   Patient ID: Jay Arnold, male    DOB: 07/17/1961  Age: 63 y.o. MRN: [id number]  Chief Complaint  Patient presents with   Annual Exam   HPI Jay Arnold is a 63 y.o. old male here  for annual exam.   Subjective - Follow-up for hypertension and hyperlipidemia. Reports checking blood pressure at home, typically 130/low 80s. Notes in the last month, systolic has increased to mid-130s, diastolic remains in the low 80s. Would like to discontinue medications but understands the need to continue.  Medications: Amlodipine  5mg  daily for hypertension, Atorvastatin  for hyperlipidemia. Reports no definitive side effects. Mentions initial muscle aches with atorvastatin  but is unsure of the cause now, denies significant pain.  PMH: Hypertension, hyperlipidemia, prediabetes (last A1C 5.8). History of shingles. Up to date on colonoscopy, next due in 2027. Up to date on shingles vaccine (completed 2-dose series). PSH: None mentioned. FH: Heart attack and stroke on both maternal and paternal sides. Uncle with prostate cancer. Social Hx: Denies tobacco use. Reports minimal alcohol use (a few beers per year). Stays active with regular workouts. Reports wife is encouraging a healthier diet.  ROS: Constitutional: Denies fatigue. Musculoskeletal: Reports some muscle aches but denies significant pain. Cardiovascular: Denies chest pain. All other systems reviewed and are negative.    The 10-year ASCVD risk score (Arnett DK, et al., 2019) is: 12.2%  Health Maintenance Due  Topic Date Due   Pneumococcal Vaccine: 50+ Years (1 of 1 - PCV) Never done   Zoster Vaccines- Shingrix (2 of 2) 06/01/2018   COVID-19 Vaccine (1 - 2024-25 season) Never done   INFLUENZA VACCINE  06/28/2024      Objective:     BP 130/89   Pulse 69   Ht 5' 9 (1.753 m)   Wt 195 lb 12.8 oz (88.8 kg)   SpO2 99%   BMI 28.91 kg/m    Physical Exam  Gen: alert, oriented Heent: perrla, eomi.  Mmm Pulm: lctab, no wheezes.  Gi: soft, nbs Msk: strength equal b/l. Normal gait Ext: no pedal edema Skin: warm and dry Psych: pleasant affect   No results found for any visits on 07/19/24.      Assessment & Plan:   Physical exam, annual  HTN, goal below 130/80 Assessment & Plan: History of hypertension, currently managed with amlodipine . Home BP readings have recently increased to mid-130s systolic. Office BP is 130/89. Current goal BP is <130/85. Continues to be physically active. - Continue amlodipine  5mg  daily. - Discussed option to increase to 10mg  if home BPs remain elevated, counseled on potential for increased risk of leg swelling. - Provided a log sheet to monitor home BP readings. - To send home BP log via MyChart for review.  Orders: -     Comprehensive metabolic panel with GFR  Chronic leukopenia -     CBC with Differential/Platelet  Mixed hyperlipidemia Assessment & Plan: Managed with atorvastatin . Started due to elevated cholesterol and 10-year ASCVD risk >10%. Last LDL was 60. Strong family history of cardiovascular disease. - Continue atorvastatin . - Counseled on dietary modifications including reducing saturated fat intake (beef, pork, dairy, processed foods) to <5g/day. - Discussed non-pharmacologic options to lower cholesterol: fiber (Metamucil/psyllium husk), plant sterols (Smart Balance), and CoQ10.  Orders: -     Comprehensive metabolic panel with GFR -     Lipid panel  Prediabetes -     Hemoglobin A1c  Prostate cancer screening -     PSA  Healthcare maintenance  Assessment & Plan: - Labs ordered today: CBC, CMP, Lipid Panel, HbA1c, PSA. - Discussed rationale for PSA screening for prostate cancer, given age and family history. - Discussed pneumococcal vaccine recommendation for age >61. Can be obtained at a pharmacy. - Follow-up in 1 year, or sooner if needed based on lab results or home BP readings.      Return in about 1 year (around  07/19/2025) for physical.    Toribio MARLA Slain, MD

## 2024-07-19 NOTE — Assessment & Plan Note (Signed)
 History of hypertension, currently managed with amlodipine . Home BP readings have recently increased to mid-130s systolic. Office BP is 130/89. Current goal BP is <130/85. Continues to be physically active. - Continue amlodipine  5mg  daily. - Discussed option to increase to 10mg  if home BPs remain elevated, counseled on potential for increased risk of leg swelling. - Provided a log sheet to monitor home BP readings. - To send home BP log via MyChart for review.

## 2024-07-19 NOTE — Assessment & Plan Note (Signed)
-   Labs ordered today: CBC, CMP, Lipid Panel, HbA1c, PSA. - Discussed rationale for PSA screening for prostate cancer, given age and family history. - Discussed pneumococcal vaccine recommendation for age >70. Can be obtained at a pharmacy. - Follow-up in 1 year, or sooner if needed based on lab results or home BP readings.

## 2024-07-19 NOTE — Patient Instructions (Signed)
 It was nice to see you today,  We addressed the following topics today: -Your blood pressure was still little bit above goal.  I would like you to recheck it at home for the next 2 weeks and then send us  a picture or upload the results to MyChart so I can evaluate them.  You can also bring the results back into our office. - I will let you know that we will do your lab test when I get them. - Continue taking your 2 medications.  These will help prevent cardiovascular disease such as heart attack and stroke. - Things you can do other than medication to help lower your cholesterol include reducing saturated fat from red meat and dairy products to less than 5 g/day and trying Metamucil as well as coq.10 and plant sterols such as Smart balance butter.  Have a great day,  Rolan Slain, MD

## 2024-07-20 LAB — LIPID PANEL
Chol/HDL Ratio: 2.5 ratio (ref 0.0–5.0)
Cholesterol, Total: 133 mg/dL (ref 100–199)
HDL: 54 mg/dL (ref >39)
LDL Chol Calc (NIH): 66 mg/dL (ref 0–99)
Triglycerides: 61 mg/dL (ref 0–149)
VLDL Cholesterol Cal: 13 mg/dL (ref 5–40)

## 2024-07-20 LAB — COMPREHENSIVE METABOLIC PANEL WITH GFR
ALT: 28 IU/L (ref 0–44)
AST: 30 IU/L (ref 0–40)
Albumin: 4.2 g/dL (ref 3.9–4.9)
Alkaline Phosphatase: 53 IU/L (ref 44–121)
BUN/Creatinine Ratio: 13 (ref 10–24)
BUN: 14 mg/dL (ref 8–27)
Bilirubin Total: 0.9 mg/dL (ref 0.0–1.2)
CO2: 22 mmol/L (ref 20–29)
Calcium: 8.7 mg/dL (ref 8.6–10.2)
Chloride: 103 mmol/L (ref 96–106)
Creatinine, Ser: 1.04 mg/dL (ref 0.76–1.27)
Globulin, Total: 2.8 g/dL (ref 1.5–4.5)
Glucose: 85 mg/dL (ref 70–99)
Potassium: 4.3 mmol/L (ref 3.5–5.2)
Sodium: 140 mmol/L (ref 134–144)
Total Protein: 7 g/dL (ref 6.0–8.5)
eGFR: 82 mL/min/1.73 (ref >59)

## 2024-07-20 LAB — CBC WITH DIFFERENTIAL/PLATELET
Basophils Absolute: 0 x10E3/uL (ref 0.0–0.2)
Basos: 1 %
EOS (ABSOLUTE): 0.1 x10E3/uL (ref 0.0–0.4)
Eos: 2 %
Hematocrit: 41.6 % (ref 37.5–51.0)
Hemoglobin: 13.8 g/dL (ref 13.0–17.7)
Immature Grans (Abs): 0 x10E3/uL (ref 0.0–0.1)
Immature Granulocytes: 0 %
Lymphocytes Absolute: 1 x10E3/uL (ref 0.7–3.1)
Lymphs: 35 %
MCH: 30.4 pg (ref 26.6–33.0)
MCHC: 33.2 g/dL (ref 31.5–35.7)
MCV: 92 fL (ref 79–97)
Monocytes Absolute: 0.4 x10E3/uL (ref 0.1–0.9)
Monocytes: 13 %
Neutrophils Absolute: 1.5 x10E3/uL (ref 1.4–7.0)
Neutrophils: 49 %
Platelets: 185 x10E3/uL (ref 150–450)
RBC: 4.54 x10E6/uL (ref 4.14–5.80)
RDW: 13.5 % (ref 11.6–15.4)
WBC: 3 x10E3/uL — ABNORMAL LOW (ref 3.4–10.8)

## 2024-07-20 LAB — PSA: Prostate Specific Ag, Serum: 2.4 ng/mL (ref 0.0–4.0)

## 2024-07-20 LAB — HEMOGLOBIN A1C
Est. average glucose Bld gHb Est-mCnc: 120 mg/dL
Hgb A1c MFr Bld: 5.8 % — ABNORMAL HIGH (ref 4.8–5.6)

## 2024-07-22 ENCOUNTER — Ambulatory Visit: Payer: Self-pay | Admitting: Family Medicine

## 2024-07-26 NOTE — Telephone Encounter (Signed)
 Spoke with patient and informed him that the 5 mg refill was his auto refill on his last rx, pharmacy stated that he had several profiles and that they were going to merge them and put the current Rx of 10 mg amlodipine  on the profile that they have been using.  Instructed pt to take 2 of the 5 mg tablets and that the new rx would be there when he needs refills next time.
